# Patient Record
Sex: Female | Born: 1980 | Race: Black or African American | Hispanic: No | Marital: Single | Smoking: Former smoker
Health system: Southern US, Community
[De-identification: ages and names within clinical notes are randomized; demographics above are authoritative.]

## PROBLEM LIST (undated history)

## (undated) DIAGNOSIS — T148XXA Other injury of unspecified body region, initial encounter: Secondary | ICD-10-CM

## (undated) DIAGNOSIS — F32A Depression, unspecified: Secondary | ICD-10-CM

## (undated) DIAGNOSIS — E119 Type 2 diabetes mellitus without complications: Secondary | ICD-10-CM

## (undated) DIAGNOSIS — R519 Headache, unspecified: Secondary | ICD-10-CM

## (undated) DIAGNOSIS — F329 Major depressive disorder, single episode, unspecified: Secondary | ICD-10-CM

## (undated) DIAGNOSIS — R51 Headache: Secondary | ICD-10-CM

## (undated) HISTORY — PX: OTHER SURGICAL HISTORY: SHX169

---
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
## 2005-05-03 ENCOUNTER — Emergency Department (HOSPITAL_COMMUNITY): Admission: EM | Admit: 2005-05-03 | Discharge: 2005-05-03 | Payer: Self-pay | Admitting: Family Medicine

## 2005-12-01 ENCOUNTER — Inpatient Hospital Stay (HOSPITAL_COMMUNITY): Admission: AD | Admit: 2005-12-01 | Discharge: 2005-12-01 | Payer: Self-pay | Admitting: Family Medicine

## 2012-04-07 ENCOUNTER — Emergency Department (HOSPITAL_COMMUNITY)
Admission: EM | Admit: 2012-04-07 | Discharge: 2012-04-07 | Disposition: A | Discharge status: Home / Self Care | Payer: Self-pay | Source: Home / Self Care | Attending: Family Medicine | Admitting: Family Medicine

## 2012-04-07 NOTE — ED Notes (Signed)
No answer in lobby @ 16:47 & 17:05

## 2012-10-30 ENCOUNTER — Emergency Department (INDEPENDENT_AMBULATORY_CARE_PROVIDER_SITE_OTHER): Payer: BC Managed Care – PPO

## 2012-10-30 ENCOUNTER — Emergency Department (HOSPITAL_COMMUNITY)
Admission: EM | Admit: 2012-10-30 | Discharge: 2012-10-30 | Disposition: A | Discharge status: Home / Self Care | Payer: BC Managed Care – PPO | Source: Home / Self Care | Attending: Emergency Medicine | Admitting: Emergency Medicine

## 2012-10-30 ENCOUNTER — Encounter (HOSPITAL_COMMUNITY): Payer: Self-pay | Admitting: Emergency Medicine

## 2012-10-30 DIAGNOSIS — J069 Acute upper respiratory infection, unspecified: Secondary | ICD-10-CM

## 2012-10-30 MED ORDER — ALBUTEROL SULFATE (5 MG/ML) 0.5% IN NEBU
INHALATION_SOLUTION | RESPIRATORY_TRACT | Status: AC
  Filled 2012-10-30: qty 0.5

## 2012-10-30 MED ORDER — ALBUTEROL SULFATE (5 MG/ML) 0.5% IN NEBU
2.5000 mg | INHALATION_SOLUTION | Freq: Once | RESPIRATORY_TRACT | Status: AC
  Administered 2012-10-30: 2.5 mg via RESPIRATORY_TRACT

## 2012-10-30 MED ORDER — ALBUTEROL SULFATE (5 MG/ML) 0.5% IN NEBU
INHALATION_SOLUTION | RESPIRATORY_TRACT | Status: AC
  Filled 2012-10-30: qty 1

## 2012-10-30 MED ORDER — ALBUTEROL SULFATE (5 MG/ML) 0.5% IN NEBU
5.0000 mg | INHALATION_SOLUTION | Freq: Once | RESPIRATORY_TRACT | Status: AC
  Administered 2012-10-30: 5 mg via RESPIRATORY_TRACT

## 2012-10-30 MED ORDER — DEXAMETHASONE SODIUM PHOSPHATE 10 MG/ML IJ SOLN
10.0000 mg | Freq: Once | INTRAMUSCULAR | Status: AC
  Administered 2012-10-30: 10 mg via INTRAMUSCULAR

## 2012-10-30 MED ORDER — DEXAMETHASONE SODIUM PHOSPHATE 10 MG/ML IJ SOLN
INTRAMUSCULAR | Status: AC
  Filled 2012-10-30: qty 1

## 2012-10-30 MED ORDER — ALBUTEROL SULFATE HFA 108 (90 BASE) MCG/ACT IN AERS: 2.0000 | INHALATION_SPRAY | RESPIRATORY_TRACT | Status: AC | PRN

## 2012-10-30 MED ORDER — PSEUDOEPH-CHLORPHEN-HYDROCOD 60-4-5 MG/5ML PO SOLN: 5.0000 mL | Freq: Four times a day (QID) | ORAL | Status: AC | PRN

## 2012-10-30 NOTE — ED Notes (Signed)
Patient instructed to put on gown.  Breathing treatment in progress.

## 2012-10-30 NOTE — ED Notes (Signed)
Went to obtain patient for CXR patient was getting a breathing treatment

## 2012-10-30 NOTE — ED Provider Notes (Signed)
History     CSN: [id number]  Arrival date & time 10/30/12  1647   None     Chief Complaint  Patient presents with  . Cough    (Consider location/radiation/quality/duration/timing/severity/associated sxs/prior treatment) HPI Comments: Pt presents c/o not feeling well for a few days.  She has productive cough, SOB, fever, chills, sore throat from coughing.  A physician she works with gave her Rx for tussionex which helps but not significantly and not for a very long time.  She also feels like she is wheezing a lot.    Patient is a 32 y.o. female presenting with cough.  Cough Associated symptoms: chills, fever, shortness of breath, sore throat and wheezing   Associated symptoms: no chest pain, no myalgias and no rash     History reviewed. No pertinent past medical history.  History reviewed. No pertinent past surgical history.  No family history on file.  History  Substance Use Topics  . Smoking status: Current Every Day Smoker  . Smokeless tobacco: Not on file  . Alcohol Use: Yes    OB History   Grav Para Term Preterm Abortions TAB SAB Ect Mult Living                  Review of Systems  Constitutional: Positive for fever and chills. Negative for fatigue.  HENT: Positive for sore throat.   Eyes: Negative for visual disturbance.  Respiratory: Positive for cough, shortness of breath and wheezing. Negative for chest tightness.   Cardiovascular: Negative for chest pain, palpitations and leg swelling.  Gastrointestinal: Negative for nausea, vomiting and abdominal pain.  Endocrine: Negative for polydipsia and polyuria.  Genitourinary: Negative for dysuria, urgency and frequency.  Musculoskeletal: Negative for myalgias and arthralgias.  Skin: Negative for rash.  Neurological: Negative for dizziness, weakness and light-headedness.    Allergies  Review of patient's allergies indicates no known allergies.  Home Medications   Current Outpatient Rx  Name  Route  Sig   Dispense  Refill  . chlorpheniramine-HYDROcodone (TUSSIONEX PENNKINETIC ER) 10-8 MG/5ML LQCR   Oral   Take 5 mLs by mouth.         Marland Kitchen OVER THE COUNTER MEDICATION      nsaids         . sertraline (ZOLOFT) 100 MG tablet   Oral   Take 100 mg by mouth daily.         . TRAZODONE HCL PO   Oral   Take by mouth.         . Vitamin D, Ergocalciferol, (DRISDOL) 50000 UNITS CAPS   Oral   Take 50,000 Units by mouth.         Marland Kitchen albuterol (PROVENTIL HFA;VENTOLIN HFA) 108 (90 BASE) MCG/ACT inhaler   Inhalation   Inhale 2 puffs into the lungs every 4 (four) hours as needed for wheezing.   1 Inhaler   1   . Pseudoeph-Chlorphen-Hydrocod 60-4-5 MG/5ML SOLN   Oral   Take 5 mLs by mouth 4 (four) times daily as needed.   200 mL   0     BP 119/63  Pulse 81  Temp(Src) 98.7 F (37.1 C) (Oral)  Resp 16  SpO2 99%  LMP 10/05/2012  Physical Exam  Nursing note and vitals reviewed. Constitutional: She is oriented to person, place, and time. Vital signs are normal. She appears well-developed and well-nourished. No distress.  HENT:  Head: Atraumatic.  Eyes: EOM are normal. Pupils are equal, round, and reactive to light.  Cardiovascular: Normal rate, regular rhythm and normal heart sounds.  Exam reveals no gallop and no friction rub.   No murmur heard. Pulmonary/Chest: Effort normal. No respiratory distress. She has wheezes (expiratory, throughout). She has rhonchi in the right lower field and the left lower field. She has no rales.  Abdominal: Soft. There is no tenderness.  Neurological: She is alert and oriented to person, place, and time. She has normal strength.  Skin: Skin is warm and dry. She is not diaphoretic.  Psychiatric: She has a normal mood and affect. Her behavior is normal. Judgment normal.    ED Course  Procedures (including critical care time)  Labs Reviewed - No data to display Dg Chest 2 View  10/30/2012   *RADIOLOGY REPORT*  Clinical Data: Productive cough.   Shortness of breath.  CHEST - 2 VIEW  Comparison: None.  Findings: Very low lung volumes are seen, however both lungs are clear.  No evidence of pleural effusion.  Heart size and mediastinal contours are normal.  IMPRESSION: Very low lung volumes.  No active disease.   Original Report Authenticated By: Myles Rosenthal, M.D.     1. URI (upper respiratory infection)     After neb, pt not really feeling better but lungs clearer to auscultation, still wheezing however.  Will repeat neb  Improved significantly after 2nd neb   MDM  Normal CXR so this is most likely viral URI.  Will send home with albuterol inhaler and cough syrup.  Discussed symptoms of pneumonia that she will look out for.  She will f/u in a couple of days if she is getting worse or in about a week if not getting better.     Meds ordered this encounter  Medications                                     . albuterol (PROVENTIL) (5 MG/ML) 0.5% nebulizer solution 2.5 mg    Sig:   . dexamethasone (DECADRON) injection 10 mg    Sig:   . albuterol (PROVENTIL) (5 MG/ML) 0.5% nebulizer solution 5 mg    Sig:   . Pseudoeph-Chlorphen-Hydrocod 60-4-5 MG/5ML SOLN    Sig: Take 5 mLs by mouth 4 (four) times daily as needed.    Dispense:  200 mL    Refill:  0  . albuterol (PROVENTIL HFA;VENTOLIN HFA) 108 (90 BASE) MCG/ACT inhaler    Sig: Inhale 2 puffs into the lungs every 4 (four) hours as needed for wheezing.    Dispense:  1 Inhaler    Refill:  1        Graylon Good, PA-C 10/30/12 1858  Graylon Good, PA-C 10/30/12 1915

## 2012-10-30 NOTE — ED Provider Notes (Signed)
Medical screening examination/treatment/procedure(s) were performed by non-physician practitioner and as supervising physician I was immediately available for consultation/collaboration.  Leslee Home, M.D.  Reuben Likes, MD 10/30/12 2056

## 2012-10-30 NOTE — ED Notes (Signed)
C/o headache, random episodes of sweating, cough, chest congestion and wheezing and feverish.

## 2014-05-26 ENCOUNTER — Other Ambulatory Visit: Payer: Self-pay | Admitting: Family Medicine

## 2014-05-26 DIAGNOSIS — M2342 Loose body in knee, left knee: Secondary | ICD-10-CM

## 2014-06-05 ENCOUNTER — Inpatient Hospital Stay: Admission: RE | Admit: 2014-06-05 | Payer: BC Managed Care – PPO | Source: Ambulatory Visit

## 2015-10-18 ENCOUNTER — Encounter: Payer: Self-pay | Attending: Surgery | Admitting: Dietician

## 2015-10-18 DIAGNOSIS — Z6841 Body Mass Index (BMI) 40.0 and over, adult: Secondary | ICD-10-CM | POA: Insufficient documentation

## 2015-10-18 NOTE — Patient Instructions (Signed)
Follow Pre-Op Goals Try Protein Shakes Call [facility] at [phone] when surgery is scheduled to enroll in Pre-Op Class  Things to remember:  Please always be honest with us. We want to support you!  If you have any questions or concerns in between appointments, please call or email Liz, Viggo Perko, or Laurie.  The diet after surgery will be high protein and low in carbohydrate.  Vitamins and calcium need to be taken for the rest of your life.  Feel free to include support people in any classes or appointments.   Supplement recommendations:  "Complete" Multivitamin: Sleeve Gastrectomy and RYGB patients take a double dose of MVI. Vitamin must be liquid or chewable but not gummy. Examples of these include Flintstones Complete and Centrum Complete. If the vitamin is bariatric-specific, take 1 dose as it is already formulated for bariatric surgery patients. Examples of these are Bariatric Advantage, Celebrate, and Wellesse. These can be found at the [facility] and/or online.     Calcium citrate: 1500 mg/day of Calcium citrate (also chewable or liquid) is recommended for all procedures. The body is only able to absorb 500-600 mg of Calcium at one time so 3 daily doses of 500 mg are recommended. Calcium doses must be taken a minimum of 2 hours apart. Additionally, Calcium must be taken 2 hours apart from iron-containing MVI. Examples of brands include Celebrate, Bariatric Advantage, and Wellesse. These brands must be purchased online or at the  Outpatient Pharmacy. Citracal Petites is the only Calcium citrate supplement found in general grocery stores and pharmacies. This is in tablet form and may be recommended for patients who do not tolerate chewable Calcium.  Continued or added Vitamin D supplementation based on individual needs.    Vitamin B12: 300-500 mcg/day for Sleeve Gastrectomy and RYGB. Must be taken intramuscularly, sublingually, or inhaled nasally. Oral route  is not recommended.  

## 2015-10-18 NOTE — Progress Notes (Signed)
  Pre-Op Assessment Visit:  Pre-Operative Sleeve gastrectomy Surgery  Medical Nutrition Therapy:  Appt start time: 0845   End time:  0930.  Patient was seen on 10/18/2015 for Pre-Operative Nutrition Assessment. Assessment and letter of approval [facility] on 10/18/2015.   Preferred Learning Style:   No preference indicated   Learning Readiness:   Ready  Handouts given during visit include:  Pre-Op Goals Bariatric Surgery Protein Shakes   During the appointment today the following Pre-Op Goals were reviewed with the patient: Maintain or lose weight as instructed by your surgeon Make healthy food choices Begin to limit portion sizes Limited concentrated sugars and fried foods Keep fat/sugar in the single digits per serving on   food labels Practice CHEWING your food  (aim for 30 chews per bite or until applesauce consistency) Practice not drinking 15 minutes before, during, and 30 minutes after each meal/snack Avoid all carbonated beverages  Avoid/limit caffeinated beverages  Avoid all sugar-sweetened beverages Consume 3 meals per day; eat every 3-5 hours Make a list of non-food related activities Aim for 64-100 ounces of FLUID daily  Aim for at least 60-80 grams of PROTEIN daily Look for a liquid protein source that contain ?15 g protein and ?5 g carbohydrate  (ex: shakes, drinks, shots)  Demonstrated degree of understanding via:  Teach Back  Teaching Method Utilized:  Visual Auditory Hands on  Barriers to learning/adherence to lifestyle change: none  [facility] to [facility] the [facility] to enroll in Pre-Op and Post-Op Nutrition Education when surgery date is scheduled.

## 2015-11-09 ENCOUNTER — Other Ambulatory Visit (HOSPITAL_COMMUNITY): Payer: Self-pay | Admitting: Surgery

## 2015-11-10 ENCOUNTER — Ambulatory Visit (HOSPITAL_COMMUNITY)
Admission: RE | Admit: 2015-11-10 | Discharge: 2015-11-10 | Disposition: A | Discharge status: Home / Self Care | Payer: BLUE CROSS/BLUE SHIELD | Source: Ambulatory Visit | Attending: Surgery | Admitting: Surgery

## 2015-11-10 ENCOUNTER — Other Ambulatory Visit: Payer: Self-pay

## 2015-11-10 DIAGNOSIS — Z6841 Body Mass Index (BMI) 40.0 and over, adult: Secondary | ICD-10-CM | POA: Diagnosis not present

## 2015-11-10 DIAGNOSIS — F17200 Nicotine dependence, unspecified, uncomplicated: Secondary | ICD-10-CM | POA: Insufficient documentation

## 2015-11-10 DIAGNOSIS — R918 Other nonspecific abnormal finding of lung field: Secondary | ICD-10-CM | POA: Diagnosis not present

## 2015-11-15 ENCOUNTER — Encounter: Payer: BLUE CROSS/BLUE SHIELD | Attending: Surgery | Admitting: Dietician

## 2015-11-15 ENCOUNTER — Encounter: Payer: Self-pay | Admitting: Dietician

## 2015-11-15 DIAGNOSIS — Z6841 Body Mass Index (BMI) 40.0 and over, adult: Secondary | ICD-10-CM | POA: Insufficient documentation

## 2015-11-15 NOTE — Patient Instructions (Signed)
Continue to work on chewing well and not drinking during meals. Continue work on increasing activity. Continue to work on [employer]AM[employer] CorporationPre Op goals.

## 2015-11-15 NOTE — Progress Notes (Signed)
Supervised Weight Loss Visit:   Pre-Operative sleeve gastrectomy Surgery  Medical Nutrition Therapy:  Appt start time: 0735 end time:  0750.  Primary concerns today: Supervised Weight Loss Visit. Returns with a 2 lb weight loss. Dr. Daphine DeutscherMartin put her on phentermine about 3 weeks ago. Also started Metformin since last visit since Hgb A1c is around 6.5%. Has not noticed a huge change in appetite.   Personal trainer put her on a vegan diet about 2 days ago. Plan is that she will be on this for about 8 weeks to "cleanse". Meets with trainer 1 x week via skype. Has been working on chewing well and trying to not drink during meals. Usually eating 3 meals per day. Has tried [employer] Lean Shake and likes it. Just drinks water. Has not been eating sugary or fried foods.   Weight: 411.3 lbs BMI: 66.4  Preferred Learning Style:   No preference indicated   Learning Readiness:   Ready   24-hr recall: B (AM): [employer] lean shake and peach Snk (AM): none  L (PM): [employer] shake and fruit Snk (PM): none  D (PM): hummus wrap with veggies Snk (PM): none  Beverages:   Medications: see list  Recent physical activity:  Walking dog, moving more, trying to get 5000 steps per day  Progress Towards Goal(s):  In progress.  Handouts given during visit include:  none   Nutritional Diagnosis:  [place]-3.3 Obesity related to past poor dietary habits and physical inactivity as evidenced by patient attending supervised weight loss for insurance approval of bariatric surgery.    Intervention:  Nutrition counseling provided. Plan: Continue to work on chewing well and not drinking during meals. Continue work on increasing activity. Continue to work on [employer] CorporationPre Op goals.   Teaching Method Utilized:  Visual Auditory Hands on  Barriers to learning/adherence to lifestyle change: none  Demonstrated degree of understanding via:  Teach Back   Monitoring/Evaluation:  Dietary intake, exercise, and body weight. Follow up in 1  months for supervised weight loss visit.

## 2015-11-22 ENCOUNTER — Other Ambulatory Visit: Payer: Self-pay | Admitting: Ophthalmology

## 2015-11-22 DIAGNOSIS — H04033 Chronic enlargement of bilateral lacrimal glands: Secondary | ICD-10-CM

## 2015-12-01 ENCOUNTER — Inpatient Hospital Stay: Admission: RE | Admit: 2015-12-01 | Payer: BLUE CROSS/BLUE SHIELD | Source: Ambulatory Visit

## 2015-12-08 ENCOUNTER — Ambulatory Visit
Admission: RE | Admit: 2015-12-08 | Discharge: 2015-12-08 | Disposition: A | Discharge status: Home / Self Care | Payer: BLUE CROSS/BLUE SHIELD | Source: Ambulatory Visit | Attending: Ophthalmology | Admitting: Ophthalmology

## 2015-12-08 DIAGNOSIS — H04033 Chronic enlargement of bilateral lacrimal glands: Secondary | ICD-10-CM

## 2015-12-08 MED ORDER — IOPAMIDOL (ISOVUE-300) INJECTION 61%
75.0000 mL | Freq: Once | INTRAVENOUS | Status: AC | PRN
Start: 2015-12-08 — End: 2015-12-08
  Administered 2015-12-08: 75 mL via INTRAVENOUS

## 2015-12-14 ENCOUNTER — Ambulatory Visit: Payer: BLUE CROSS/BLUE SHIELD | Admitting: Dietician

## 2016-05-14 ENCOUNTER — Encounter: Payer: BLUE CROSS/BLUE SHIELD | Attending: Surgery | Admitting: Dietician

## 2016-05-14 DIAGNOSIS — Z713 Dietary counseling and surveillance: Secondary | ICD-10-CM | POA: Insufficient documentation

## 2016-05-14 DIAGNOSIS — Z6841 Body Mass Index (BMI) 40.0 and over, adult: Secondary | ICD-10-CM | POA: Diagnosis not present

## 2016-05-14 NOTE — Progress Notes (Signed)
Pt is scheduled for preop appt on 05/17/2016; please place surgical orders in epic. Thanks.  

## 2016-05-15 ENCOUNTER — Encounter: Payer: Self-pay | Admitting: Dietician

## 2016-05-15 NOTE — Progress Notes (Signed)
EKG 11-10-15 EPIC  CHEST XRAY 11-10-15 EPIC

## 2016-05-15 NOTE — Progress Notes (Signed)
  Pre-Operative Nutrition Class:  Appt start time: 2426   End time:  1830.  Patient was seen on 05/14/2016 for Pre-Operative Bariatric Surgery Education at the [facility].   Surgery date: 05/22/2016 Surgery type: sleeve gastrectomy Start weight at [facility]: 413 lbs on 10/18/2015 Weight today: 409.3 lbs  TANITA  BODY COMP RESULTS  05/14/16   BMI (kg/m^2) n/a   Fat Mass (lbs)    Fat Free Mass (lbs)    Total Body Water (lbs)    Samples given per MNT protocol. Patient educated on appropriate usage: Premier protein shake (strawberry - qty 1) Lot #: [id number] Exp: 03/2017  Unjury Protein Powder (chicken soup - qty 1) Lot #: [id number] Exp: 08/2017  The following the learning objectives were met by the patient during this course:  Identify Pre-Op Dietary Goals and will begin 2 weeks pre-operatively  Identify appropriate sources of fluids and proteins   State protein recommendations and appropriate sources pre and post-operatively  Identify Post-Operative Dietary Goals and will follow for 2 weeks post-operatively  Identify appropriate multivitamin and calcium sources  Describe the need for physical activity post-operatively and will follow MD recommendations  State when to call healthcare provider regarding medication questions or post-operative complications  Handouts given during class include:  Pre-Op Bariatric Surgery Diet Handout  Protein Shake Handout  Post-Op Bariatric Surgery Nutrition Handout  [facility] Program Information Flyer  Support Group Information Flyer  [facility] Bariatric Supplements Price List  Follow-Up Plan: Patient will follow-up at [facility] 2 weeks post operatively for diet advancement per MD.

## 2016-05-15 NOTE — Patient Instructions (Addendum)
Felipe DroneShavon G Shannahan  05/15/2016   Your procedure is scheduled on: 05-22-16  [facility]  [facility] 3rd floor to  Short Stay Center at 100 PM  Call this number if you have problems the morning of surgery [phone]   Remember: ONLY 1 PERSON MAY GO WITH YOU TO SHORT STAY TO GET  READY MORNING OF YOUR SURGERY.  Do not eat food :After Midnight, clear liquids from midnight until 900 am day of surgery, nothing by mouth after 900 am day of surgery.     Take these medicines the morning of surgery with A SIP OF WATER: DESVONLAFAXINE (PRESTIQ),  DO NOT TAKE ANY DIABETIC MEDICATIONS DAY OF YOUR SURGERY                               You may not have any metal on your body including hair pins and              piercings  Do not wear jewelry, make-up, lotions, powders or perfumes, deodorant             Do not wear nail polish.  Do not shave  48 hours prior to surgery.              Men may shave face and neck.   Do not bring valuables to the hospital. [facility] IS NOT             RESPONSIBLE   FOR VALUABLES.  Contacts, dentures or bridgework may not be worn into surgery.  Leave suitcase in the car. After surgery it may be brought to your room.                 Please read over the following fact sheets you were given: _____________________________________________________________________                CLEAR LIQUID DIET   Foods Allowed                                                                     Foods Excluded  Coffee and tea, regular and decaf                             liquids that you cannot  Plain Jell-O in any flavor                                             see through such as: Fruit ices (not with fruit pulp)                                     milk, soups, orange juice  Iced Popsicles  All solid food Carbonated beverages, regular and diet                                     Cranberry, grape and apple juices Sports drinks like Gatorade Lightly seasoned clear broth or consume(fat free) Sugar, honey syrup  Sample Menu Breakfast                                Lunch                                     Supper Cranberry juice                    Beef broth                            Chicken broth Jell-O                                     Grape juice                           Apple juice Coffee or tea                        Jell-O                                      Popsicle                                                Coffee or tea                        Coffee or tea  _____________________________________________________________________  How to Manage Your Diabetes Before and After Surgery  Why is it important to control my blood sugar before and after surgery? . Improving blood sugar levels before and after surgery helps healing and can limit problems. . A way of improving blood sugar control is eating a healthy diet by: o  Eating less sugar and carbohydrates o  Increasing activity/exercise o  Talking with your doctor about reaching your blood sugar goals . High blood sugars (greater than 180 mg/dL) can raise your risk of infections and slow your recovery, so you will need to focus on controlling your diabetes during the weeks before surgery. . Make sure that the doctor who takes care of your diabetes knows about your planned surgery including the date and location.  How do I manage my blood sugar before surgery? . Check your blood sugar at least 4 times a day, starting 2 days before surgery, to make sure that the level is not too high or low. o Check your blood sugar the morning of your surgery when you wake up and every 2 hours until you get to the Short Stay unit. . If your blood sugar is less  than 70 mg/dL, you will need to treat for low blood sugar: o Do not take insulin. o Treat a low blood sugar (less than 70 mg/dL) with  cup of clear juice  (cranberry or apple), 4 glucose tablets, OR glucose gel. o Recheck blood sugar in 15 minutes after treatment (to make sure it is greater than 70 mg/dL). If your blood sugar is not greater than 70 mg/dL on recheck, call [phone] for further instructions. . Report your blood sugar to the short stay nurse when you get to Short Stay.  . If you are admitted to the hospital after surgery: o Your blood sugar will be checked by the staff and you will probably be given insulin after surgery (instead of oral diabetes medicines) to make sure you have good blood sugar levels. o The goal for blood sugar control after surgery is 80-180 mg/dL.   WHAT DO I DO ABOUT MY DIABETES MEDICATION? YOU MAY TAKE METFORMIN THE DAY BEFORE YOUR SURGERY . Do not take oral diabetes medicines (pills) the morning of surgery.      Patient Signature:  Date:   Nurse Signature:  Date:   Reviewed and Endorsed by [facility] Education [facility], August 2015[facility] - Preparing for Surgery Before surgery, you can play an important role.  Because skin is not sterile, your skin needs to be as free of germs as possible.  You can reduce the number of germs on your skin by washing with CHG (chlorahexidine gluconate) soap before surgery.  CHG is an antiseptic cleaner which kills germs and bonds with the skin to continue killing germs even after washing. Please DO NOT use if you have an allergy to CHG or antibacterial soaps.  If your skin becomes reddened/irritated stop using the CHG and inform your nurse when you arrive at Short Stay. Do not shave (including legs and underarms) for at least 48 hours prior to the first CHG shower.  You may shave your face/neck. Please follow these instructions carefully:  1.  Shower with CHG Soap the night before surgery and the  morning of Surgery.  2.  If you choose to wash your hair, wash your hair first as usual with your  normal  shampoo.  3.  After you shampoo, rinse your hair and  body thoroughly to remove the  shampoo.                           4.  Use CHG as you would any other liquid soap.  You can apply chg directly  to the skin and wash                       Gently with a scrungie or clean washcloth.  5.  Apply the CHG Soap to your body ONLY FROM THE NECK DOWN.   Do not use on face/ open                           Wound or open sores. Avoid contact with eyes, ears mouth and genitals (private parts).                       Wash face,  Genitals (private parts) with your normal soap.             6.  Wash thoroughly, paying special attention to the area where your surgery  will  be performed.  7.  Thoroughly rinse your body with warm water from the neck down.  8.  DO NOT shower/wash with your normal soap after using and rinsing off  the CHG Soap.                9.  Pat yourself dry with a clean towel.            10.  Wear clean pajamas.            11.  Place clean sheets on your bed the night of your first shower and do not  sleep with pets. Day of Surgery : Do not apply any lotions/deodorants the morning of surgery.  Please wear clean clothes to the hospital/surgery center.

## 2016-05-16 ENCOUNTER — Ambulatory Visit: Payer: Self-pay | Admitting: Surgery

## 2016-05-17 ENCOUNTER — Encounter (HOSPITAL_COMMUNITY)
Admission: RE | Admit: 2016-05-17 | Discharge: 2016-05-17 | Disposition: A | Discharge status: Home / Self Care | Payer: BLUE CROSS/BLUE SHIELD | Source: Ambulatory Visit | Attending: Surgery | Admitting: Surgery

## 2016-05-17 ENCOUNTER — Encounter (HOSPITAL_COMMUNITY): Payer: Self-pay

## 2016-05-17 DIAGNOSIS — E669 Obesity, unspecified: Secondary | ICD-10-CM | POA: Diagnosis not present

## 2016-05-17 DIAGNOSIS — Z6841 Body Mass Index (BMI) 40.0 and over, adult: Secondary | ICD-10-CM | POA: Diagnosis not present

## 2016-05-17 DIAGNOSIS — Z01812 Encounter for preprocedural laboratory examination: Secondary | ICD-10-CM | POA: Diagnosis present

## 2016-05-17 HISTORY — DX: Major depressive disorder, single episode, unspecified: F32.9

## 2016-05-17 HISTORY — DX: Other injury of unspecified body region, initial encounter: T14.8XXA

## 2016-05-17 HISTORY — DX: Type 2 diabetes mellitus without complications: E11.9

## 2016-05-17 HISTORY — DX: Headache, unspecified: R51.9

## 2016-05-17 HISTORY — DX: Headache: R51

## 2016-05-17 HISTORY — DX: Depression, unspecified: F32.A

## 2016-05-17 LAB — CBC WITH DIFFERENTIAL/PLATELET
BASOS ABS: 0 10*3/uL (ref 0.0–0.1)
Basophils Relative: 0 %
EOS ABS: 0.1 10*3/uL (ref 0.0–0.7)
Eosinophils Relative: 1 %
HCT: 40.8 % (ref 36.0–46.0)
HEMOGLOBIN: 13.3 g/dL (ref 12.0–15.0)
LYMPHS ABS: 2 10*3/uL (ref 0.7–4.0)
LYMPHS PCT: 25 %
MCH: 24.4 pg — ABNORMAL LOW (ref 26.0–34.0)
MCHC: 32.6 g/dL (ref 30.0–36.0)
MCV: 74.7 fL — ABNORMAL LOW (ref 78.0–100.0)
MONO ABS: 0.5 10*3/uL (ref 0.1–1.0)
Monocytes Relative: 6 %
NEUTROS ABS: 5.2 10*3/uL (ref 1.7–7.7)
Neutrophils Relative %: 68 %
PLATELETS: 381 10*3/uL (ref 150–400)
RBC: 5.46 MIL/uL — ABNORMAL HIGH (ref 3.87–5.11)
RDW: 14.3 % (ref 11.5–15.5)
WBC: 7.8 10*3/uL (ref 4.0–10.5)

## 2016-05-17 LAB — GLUCOSE, CAPILLARY: GLUCOSE-CAPILLARY: 130 mg/dL — AB (ref 65–99)

## 2016-05-17 LAB — COMPREHENSIVE METABOLIC PANEL
ALBUMIN: 3.6 g/dL (ref 3.5–5.0)
ALT: 9 U/L — ABNORMAL LOW (ref 14–54)
ANION GAP: 8 (ref 5–15)
AST: 18 U/L (ref 15–41)
Alkaline Phosphatase: 70 U/L (ref 38–126)
BUN: 13 mg/dL (ref 6–20)
CHLORIDE: 103 mmol/L (ref 101–111)
CO2: 25 mmol/L (ref 22–32)
Calcium: 9.1 mg/dL (ref 8.9–10.3)
Creatinine, Ser: 0.61 mg/dL (ref 0.44–1.00)
GFR calc non Af Amer: >60 mL/min (ref >60)
GLUCOSE: 106 mg/dL — AB (ref 65–99)
POTASSIUM: 4.2 mmol/L (ref 3.5–5.1)
SODIUM: 136 mmol/L (ref 135–145)
Total Bilirubin: 0.5 mg/dL (ref 0.3–1.2)
Total Protein: 7.6 g/dL (ref 6.5–8.1)

## 2016-05-17 NOTE — Progress Notes (Signed)
barimas bed with trapeze ordered with derrick from portable equipment

## 2016-05-18 LAB — HEMOGLOBIN A1C
HEMOGLOBIN A1C: 6.2 % — AB (ref 4.8–5.6)
MEAN PLASMA GLUCOSE: 131 mg/dL

## 2016-05-22 ENCOUNTER — Inpatient Hospital Stay (HOSPITAL_COMMUNITY): Payer: BLUE CROSS/BLUE SHIELD | Admitting: Anesthesiology

## 2016-05-22 ENCOUNTER — Inpatient Hospital Stay (HOSPITAL_COMMUNITY)
Admission: RE | Admit: 2016-05-22 | Discharge: 2016-05-24 | DRG: 621 | Disposition: A | Discharge status: Home / Self Care | Payer: BLUE CROSS/BLUE SHIELD | Source: Ambulatory Visit | Attending: Surgery | Admitting: Surgery

## 2016-05-22 ENCOUNTER — Encounter (HOSPITAL_COMMUNITY)
Admission: RE | Disposition: A | Discharge status: Home / Self Care | Payer: Self-pay | Source: Ambulatory Visit | Attending: Surgery

## 2016-05-22 ENCOUNTER — Encounter (HOSPITAL_COMMUNITY): Payer: Self-pay | Admitting: *Deleted

## 2016-05-22 DIAGNOSIS — Z7984 Long term (current) use of oral hypoglycemic drugs: Secondary | ICD-10-CM | POA: Diagnosis not present

## 2016-05-22 DIAGNOSIS — Z6841 Body Mass Index (BMI) 40.0 and over, adult: Secondary | ICD-10-CM | POA: Diagnosis not present

## 2016-05-22 DIAGNOSIS — Z79899 Other long term (current) drug therapy: Secondary | ICD-10-CM

## 2016-05-22 DIAGNOSIS — Z9884 Bariatric surgery status: Secondary | ICD-10-CM

## 2016-05-22 DIAGNOSIS — Z888 Allergy status to other drugs, medicaments and biological substances status: Secondary | ICD-10-CM | POA: Diagnosis not present

## 2016-05-22 HISTORY — PX: LAPAROSCOPIC GASTRIC SLEEVE RESECTION: SHX5895

## 2016-05-22 LAB — CREATININE, SERUM
Creatinine, Ser: 0.77 mg/dL (ref 0.44–1.00)
GFR calc Af Amer: >60 mL/min (ref >60)

## 2016-05-22 LAB — GLUCOSE, CAPILLARY
GLUCOSE-CAPILLARY: 170 mg/dL — AB (ref 65–99)
GLUCOSE-CAPILLARY: 94 mg/dL (ref 65–99)
Glucose-Capillary: 204 mg/dL — ABNORMAL HIGH (ref 65–99)
Glucose-Capillary: 232 mg/dL — ABNORMAL HIGH (ref 65–99)

## 2016-05-22 LAB — CBC
HEMATOCRIT: 39.9 % (ref 36.0–46.0)
HEMOGLOBIN: 12.9 g/dL (ref 12.0–15.0)
MCH: 23.9 pg — ABNORMAL LOW (ref 26.0–34.0)
MCHC: 32.3 g/dL (ref 30.0–36.0)
MCV: 74 fL — ABNORMAL LOW (ref 78.0–100.0)
Platelets: 346 10*3/uL (ref 150–400)
RBC: 5.39 MIL/uL — ABNORMAL HIGH (ref 3.87–5.11)
RDW: 14.2 % (ref 11.5–15.5)
WBC: 17.1 10*3/uL — ABNORMAL HIGH (ref 4.0–10.5)

## 2016-05-22 LAB — PREGNANCY, URINE: PREG TEST UR: NEGATIVE

## 2016-05-22 SURGERY — GASTRECTOMY, SLEEVE, LAPAROSCOPIC
Anesthesia: General | Site: Abdomen

## 2016-05-22 MED ORDER — ROCURONIUM BROMIDE 50 MG/5ML IV SOSY
PREFILLED_SYRINGE | INTRAVENOUS | Status: AC
  Filled 2016-05-22: qty 5

## 2016-05-22 MED ORDER — PROPOFOL 10 MG/ML IV BOLUS
INTRAVENOUS | Status: AC
  Filled 2016-05-22: qty 20

## 2016-05-22 MED ORDER — HYDROMORPHONE HCL 1 MG/ML IJ SOLN
0.2500 mg | INTRAMUSCULAR | Status: DC | PRN
  Administered 2016-05-22 (×4): 0.5 mg via INTRAVENOUS

## 2016-05-22 MED ORDER — ONDANSETRON HCL 4 MG/2ML IJ SOLN
INTRAMUSCULAR | Status: AC
  Filled 2016-05-22: qty 2

## 2016-05-22 MED ORDER — SUGAMMADEX SODIUM 200 MG/2ML IV SOLN
INTRAVENOUS | Status: DC | PRN
  Administered 2016-05-22 (×2): 200 mg via INTRAVENOUS

## 2016-05-22 MED ORDER — LIDOCAINE 2% (20 MG/ML) 5 ML SYRINGE
INTRAMUSCULAR | Status: DC | PRN
  Administered 2016-05-22: 100 mg via INTRAVENOUS

## 2016-05-22 MED ORDER — ACETAMINOPHEN 160 MG/5ML PO SOLN
650.0000 mg | ORAL | Status: DC | PRN
  Administered 2016-05-23: 650 mg via ORAL
  Filled 2016-05-22: qty 20.3

## 2016-05-22 MED ORDER — CHLORHEXIDINE GLUCONATE CLOTH 2 % EX PADS: 6.0000 | MEDICATED_PAD | Freq: Once | CUTANEOUS | Status: DC

## 2016-05-22 MED ORDER — HEPARIN SODIUM (PORCINE) 5000 UNIT/ML IJ SOLN
5000.0000 [IU] | Freq: Three times a day (TID) | INTRAMUSCULAR | Status: DC
  Administered 2016-05-22 – 2016-05-24 (×5): 5000 [IU] via SUBCUTANEOUS
  Filled 2016-05-22 (×5): qty 1

## 2016-05-22 MED ORDER — FENTANYL CITRATE (PF) 250 MCG/5ML IJ SOLN
INTRAMUSCULAR | Status: AC
  Filled 2016-05-22: qty 5

## 2016-05-22 MED ORDER — MIDAZOLAM HCL 2 MG/2ML IJ SOLN
INTRAMUSCULAR | Status: AC
  Filled 2016-05-22: qty 2

## 2016-05-22 MED ORDER — LIDOCAINE 2% (20 MG/ML) 5 ML SYRINGE
INTRAMUSCULAR | Status: AC
  Filled 2016-05-22: qty 5

## 2016-05-22 MED ORDER — INSULIN ASPART 100 UNIT/ML ~~LOC~~ SOLN
0.0000 [IU] | SUBCUTANEOUS | Status: DC
  Administered 2016-05-22: 7 [IU] via SUBCUTANEOUS
  Administered 2016-05-23 (×2): 4 [IU] via SUBCUTANEOUS
  Administered 2016-05-23 – 2016-05-24 (×2): 3 [IU] via SUBCUTANEOUS

## 2016-05-22 MED ORDER — APREPITANT 80 MG PO CAPS
80.0000 mg | ORAL_CAPSULE | ORAL | Status: AC
  Administered 2016-05-22: 80 mg via ORAL
  Filled 2016-05-22: qty 1

## 2016-05-22 MED ORDER — SUGAMMADEX SODIUM 200 MG/2ML IV SOLN
INTRAVENOUS | Status: AC
  Filled 2016-05-22: qty 2

## 2016-05-22 MED ORDER — HYDROMORPHONE HCL 1 MG/ML IJ SOLN
INTRAMUSCULAR | Status: AC
  Administered 2016-05-22: 0.5 mg via INTRAVENOUS
  Filled 2016-05-22: qty 1

## 2016-05-22 MED ORDER — ACETAMINOPHEN 10 MG/ML IV SOLN
1000.0000 mg | Freq: Once | INTRAVENOUS | Status: AC
  Administered 2016-05-22: 1000 mg via INTRAVENOUS

## 2016-05-22 MED ORDER — ROCURONIUM BROMIDE 50 MG/5ML IV SOSY: PREFILLED_SYRINGE | INTRAVENOUS | Status: DC | PRN

## 2016-05-22 MED ORDER — SUCCINYLCHOLINE CHLORIDE 200 MG/10ML IV SOSY
PREFILLED_SYRINGE | INTRAVENOUS | Status: AC
  Filled 2016-05-22: qty 10

## 2016-05-22 MED ORDER — CEFOTETAN DISODIUM-DEXTROSE 2-2.08 GM-% IV SOLR
2.0000 g | INTRAVENOUS | Status: AC
  Administered 2016-05-22: 2 g via INTRAVENOUS

## 2016-05-22 MED ORDER — MIDAZOLAM HCL 2 MG/2ML IJ SOLN
INTRAMUSCULAR | Status: DC | PRN
  Administered 2016-05-22: 2 mg via INTRAVENOUS

## 2016-05-22 MED ORDER — SUCCINYLCHOLINE CHLORIDE 200 MG/10ML IV SOSY
PREFILLED_SYRINGE | INTRAVENOUS | Status: DC | PRN
  Administered 2016-05-22: 120 mg via INTRAVENOUS

## 2016-05-22 MED ORDER — ONDANSETRON HCL 4 MG/2ML IJ SOLN
4.0000 mg | INTRAMUSCULAR | Status: DC | PRN
  Administered 2016-05-23 – 2016-05-24 (×3): 4 mg via INTRAVENOUS
  Filled 2016-05-22 (×3): qty 2

## 2016-05-22 MED ORDER — SODIUM CHLORIDE 0.9 % IJ SOLN
INTRAMUSCULAR | Status: DC | PRN
  Administered 2016-05-22: 10 mL

## 2016-05-22 MED ORDER — BUPIVACAINE LIPOSOME 1.3 % IJ SUSP
20.0000 mL | Freq: Once | INTRAMUSCULAR | Status: AC
  Administered 2016-05-22: 20 mL
  Filled 2016-05-22: qty 20

## 2016-05-22 MED ORDER — ONDANSETRON HCL 4 MG/2ML IJ SOLN
INTRAMUSCULAR | Status: DC | PRN
  Administered 2016-05-22: 4 mg via INTRAVENOUS

## 2016-05-22 MED ORDER — CEFOTETAN DISODIUM-DEXTROSE 2-2.08 GM-% IV SOLR
INTRAVENOUS | Status: AC
  Filled 2016-05-22: qty 50

## 2016-05-22 MED ORDER — OXYCODONE HCL 5 MG/5ML PO SOLN
5.0000 mg | ORAL | Status: DC | PRN
  Administered 2016-05-24 (×3): 5 mg via ORAL
  Filled 2016-05-22 (×3): qty 5

## 2016-05-22 MED ORDER — LACTATED RINGERS IV SOLN
INTRAVENOUS | Status: DC
  Administered 2016-05-22: 1000 mL via INTRAVENOUS

## 2016-05-22 MED ORDER — KCL IN DEXTROSE-NACL 20-5-0.45 MEQ/L-%-% IV SOLN
INTRAVENOUS | Status: DC
  Administered 2016-05-22 – 2016-05-23 (×3): via INTRAVENOUS
  Filled 2016-05-22 (×5): qty 1000

## 2016-05-22 MED ORDER — ACETAMINOPHEN 10 MG/ML IV SOLN
INTRAVENOUS | Status: AC
  Administered 2016-05-22: 1000 mg via INTRAVENOUS
  Filled 2016-05-22: qty 100

## 2016-05-22 MED ORDER — PANTOPRAZOLE SODIUM 40 MG IV SOLR
40.0000 mg | Freq: Every day | INTRAVENOUS | Status: DC
  Administered 2016-05-22 – 2016-05-23 (×2): 40 mg via INTRAVENOUS
  Filled 2016-05-22 (×2): qty 40

## 2016-05-22 MED ORDER — METOCLOPRAMIDE HCL 5 MG/ML IJ SOLN
INTRAMUSCULAR | Status: AC
  Administered 2016-05-22: 10 mg via INTRAVENOUS
  Filled 2016-05-22: qty 2

## 2016-05-22 MED ORDER — ROCURONIUM BROMIDE 50 MG/5ML IV SOSY
PREFILLED_SYRINGE | INTRAVENOUS | Status: DC | PRN
  Administered 2016-05-22: 50 mg via INTRAVENOUS
  Administered 2016-05-22: 10 mg via INTRAVENOUS

## 2016-05-22 MED ORDER — METOCLOPRAMIDE HCL 5 MG/ML IJ SOLN
10.0000 mg | Freq: Once | INTRAMUSCULAR | Status: AC
  Administered 2016-05-22: 10 mg via INTRAVENOUS

## 2016-05-22 MED ORDER — DEXAMETHASONE SODIUM PHOSPHATE 10 MG/ML IJ SOLN
INTRAMUSCULAR | Status: AC
  Filled 2016-05-22: qty 1

## 2016-05-22 MED ORDER — SODIUM CHLORIDE 0.9 % IJ SOLN
INTRAMUSCULAR | Status: AC
  Filled 2016-05-22: qty 10

## 2016-05-22 MED ORDER — 0.9 % SODIUM CHLORIDE (POUR BTL) OPTIME
TOPICAL | Status: DC | PRN
  Administered 2016-05-22: 1000 mL

## 2016-05-22 MED ORDER — HEPARIN SODIUM (PORCINE) 5000 UNIT/ML IJ SOLN
5000.0000 [IU] | INTRAMUSCULAR | Status: AC
  Administered 2016-05-22: 5000 [IU] via SUBCUTANEOUS
  Filled 2016-05-22: qty 1

## 2016-05-22 MED ORDER — ACETAMINOPHEN 160 MG/5ML PO SOLN: 325.0000 mg | ORAL | Status: DC | PRN

## 2016-05-22 MED ORDER — MORPHINE SULFATE (PF) 2 MG/ML IV SOLN
2.0000 mg | INTRAVENOUS | Status: DC | PRN
  Administered 2016-05-22: 4 mg via INTRAVENOUS
  Administered 2016-05-22: 2 mg via INTRAVENOUS
  Administered 2016-05-23 (×2): 4 mg via INTRAVENOUS
  Administered 2016-05-23: 6 mg via INTRAVENOUS
  Administered 2016-05-23 (×3): 4 mg via INTRAVENOUS
  Filled 2016-05-22 (×3): qty 2
  Filled 2016-05-22: qty 3
  Filled 2016-05-22 (×3): qty 2
  Filled 2016-05-22: qty 1

## 2016-05-22 MED ORDER — PROPOFOL 10 MG/ML IV BOLUS
INTRAVENOUS | Status: DC | PRN
  Administered 2016-05-22: 200 mg via INTRAVENOUS

## 2016-05-22 MED ORDER — PROMETHAZINE HCL 25 MG/ML IJ SOLN: 6.2500 mg | INTRAMUSCULAR | Status: DC | PRN

## 2016-05-22 MED ORDER — PREMIER PROTEIN SHAKE
2.0000 [oz_av] | ORAL | Status: DC
  Administered 2016-05-24 (×3): 2 [oz_av] via ORAL

## 2016-05-22 MED ORDER — LACTATED RINGERS IR SOLN
Status: DC | PRN
  Administered 2016-05-22: 3000 mL

## 2016-05-22 MED ORDER — FENTANYL CITRATE (PF) 250 MCG/5ML IJ SOLN
INTRAMUSCULAR | Status: DC | PRN
  Administered 2016-05-22: 25 ug via INTRAVENOUS
  Administered 2016-05-22: 50 ug via INTRAVENOUS
  Administered 2016-05-22: 25 ug via INTRAVENOUS
  Administered 2016-05-22 (×3): 50 ug via INTRAVENOUS

## 2016-05-22 MED ORDER — DEXAMETHASONE SODIUM PHOSPHATE 10 MG/ML IJ SOLN
INTRAMUSCULAR | Status: DC | PRN
  Administered 2016-05-22: 10 mg via INTRAVENOUS

## 2016-05-22 SURGICAL SUPPLY — 63 items
APPLICATOR COTTON TIP 6IN STRL (MISCELLANEOUS) IMPLANT
APPLIER CLIP 5 13 M/L LIGAMAX5 (MISCELLANEOUS)
APPLIER CLIP ROT 10 11.4 M/L (STAPLE)
APPLIER CLIP ROT 13.4 12 LRG (CLIP)
BLADE SURG 15 STRL LF DISP TIS (BLADE) ×1 IMPLANT
BLADE SURG 15 STRL SS (BLADE) ×1
CABLE HIGH FREQUENCY MONO STRZ (ELECTRODE) ×2 IMPLANT
CLIP APPLIE 5 13 M/L LIGAMAX5 (MISCELLANEOUS) IMPLANT
CLIP APPLIE ROT 10 11.4 M/L (STAPLE) IMPLANT
CLIP APPLIE ROT 13.4 12 LRG (CLIP) IMPLANT
COVER SURGICAL LIGHT HANDLE (MISCELLANEOUS) ×2 IMPLANT
DERMABOND ADVANCED (GAUZE/BANDAGES/DRESSINGS) ×1
DERMABOND ADVANCED .7 DNX12 (GAUZE/BANDAGES/DRESSINGS) ×1 IMPLANT
DEVICE SUT QUICK LOAD TK 5 (STAPLE) IMPLANT
DEVICE SUT TI-KNOT TK 5X26 (MISCELLANEOUS) IMPLANT
DEVICE SUTURE ENDOST 10MM (ENDOMECHANICALS) IMPLANT
DEVICE TROCAR PUNCTURE CLOSURE (ENDOMECHANICALS) ×2 IMPLANT
DISSECTOR BLUNT TIP ENDO 5MM (MISCELLANEOUS) IMPLANT
ELECT REM PT RETURN 9FT ADLT (ELECTROSURGICAL) ×2
ELECTRODE REM PT RTRN 9FT ADLT (ELECTROSURGICAL) ×1 IMPLANT
GAUZE SPONGE 4X4 12PLY STRL (GAUZE/BANDAGES/DRESSINGS) IMPLANT
GLOVE BIOGEL M 8.0 STRL (GLOVE) ×2 IMPLANT
GOWN STRL REUS W/TWL XL LVL3 (GOWN DISPOSABLE) ×8 IMPLANT
HANDLE STAPLE EGIA 4 XL (STAPLE) ×2 IMPLANT
HOVERMATT SINGLE USE (MISCELLANEOUS) ×2 IMPLANT
IRRIG SUCT STRYKERFLOW 2 WTIP (MISCELLANEOUS)
IRRIGATION SUCT STRKRFLW 2 WTP (MISCELLANEOUS) IMPLANT
KIT BASIN OR (CUSTOM PROCEDURE TRAY) ×2 IMPLANT
LIQUID BAND (GAUZE/BANDAGES/DRESSINGS) ×2 IMPLANT
MARKER SKIN DUAL TIP RULER LAB (MISCELLANEOUS) ×2 IMPLANT
NEEDLE SPNL 22GX3.5 QUINCKE BK (NEEDLE) ×2 IMPLANT
PACK UNIVERSAL I (CUSTOM PROCEDURE TRAY) ×2 IMPLANT
POUCH SPECIMEN RETRIEVAL 10MM (ENDOMECHANICALS) IMPLANT
RELOAD TRI 45 ART MED THCK BLK (STAPLE) ×2 IMPLANT
RELOAD TRI 45 ART MED THCK PUR (STAPLE) IMPLANT
RELOAD TRI 60 ART MED THCK BLK (STAPLE) ×4 IMPLANT
RELOAD TRI 60 ART MED THCK PUR (STAPLE) ×4 IMPLANT
SCISSORS LAP 5X45 EPIX DISP (ENDOMECHANICALS) IMPLANT
SEALANT SURGICAL APPL DUAL CAN (MISCELLANEOUS) IMPLANT
SET IRRIG TUBING LAPAROSCOPIC (IRRIGATION / IRRIGATOR) ×2 IMPLANT
SHEARS HARMONIC ACE PLUS 45CM (MISCELLANEOUS) ×2 IMPLANT
SLEEVE ADV FIXATION 5X100MM (TROCAR) ×4 IMPLANT
SLEEVE GASTRECTOMY 36FR VISIGI (MISCELLANEOUS) ×2 IMPLANT
SOLUTION ANTI FOG 6CC (MISCELLANEOUS) ×2 IMPLANT
SPONGE LAP 18X18 X RAY DECT (DISPOSABLE) ×2 IMPLANT
STAPLER VISISTAT 35W (STAPLE) ×2 IMPLANT
SUT SURGIDAC NAB ES-9 0 48 120 (SUTURE) IMPLANT
SUT VIC AB 4-0 SH 18 (SUTURE) ×2 IMPLANT
SUT VICRYL 0 TIES 12 18 (SUTURE) ×2 IMPLANT
SYR 10ML ECCENTRIC (SYRINGE) ×2 IMPLANT
SYR 20CC LL (SYRINGE) ×2 IMPLANT
SYR 50ML LL SCALE MARK (SYRINGE) ×2 IMPLANT
SYSTEM WECK SHIELD CLOSURE (TROCAR) IMPLANT
TOWEL OR 17X26 10 PK STRL BLUE (TOWEL DISPOSABLE) ×4 IMPLANT
TOWEL OR NON WOVEN STRL DISP B (DISPOSABLE) ×2 IMPLANT
TROCAR ADV FIXATION 12X100MM (TROCAR) ×2 IMPLANT
TROCAR ADV FIXATION 5X100MM (TROCAR) ×2 IMPLANT
TROCAR BLADELESS 15MM (ENDOMECHANICALS) ×2 IMPLANT
TROCAR BLADELESS OPT 5 100 (ENDOMECHANICALS) ×2 IMPLANT
TUBE CALIBRATION LAPBAND (TUBING) IMPLANT
TUBING CONNECTING 10 (TUBING) ×4 IMPLANT
TUBING ENDO SMARTCAP (MISCELLANEOUS) ×2 IMPLANT
TUBING INSUF HEATED (TUBING) ×2 IMPLANT

## 2016-05-22 NOTE — Op Note (Signed)
Name:  Taylor Jimenez MRN: [id number] Date of Surgery: 05/22/2016  Preop Diagnosis:  Morbid Obesity  Postop Diagnosis:  Morbid Obesity (Weight - 409, BMI - 66.1), S/P Gastric Sleeve  Procedure:  Upper endoscopy  (Intraoperative)  Surgeon:  Ovidio Kinavid Kimberleigh Mehan, M.D.  Anesthesia:  GET  Indications for procedure: Taylor Jimenez is a 35 y.o. female whose [facility] and has completed a Gastric Sleeve today by Dr. Daphine DeutscherMartin.  I am doing an intraoperative upper endoscopy to evaluate the gastric pouch.  Operative Note: The patient is under general anesthesia.  Dr. Daphine DeutscherMartin is laparoscoping the patient while I do an upper endoscopy to evaluate the stomach pouch.  With the patient intubated, I passed the Pentax upper endoscope without difficulty down the esophagus.  The esophagus was unremarkable.  The esophago-gastric junction was at 40 cm.    The mucosa of the stomach looked viable and the staple line was intact without bleeding.  I advanced the scope to the pylorus, but did not go through it.  While I insufflated the stomach pouch with air, Dr. Daphine DeutscherMartin  flooded the upper abdomen with saline to put the gastric pouch under saline.  There was no bubbling or evidence of a leak.  There was no evidence of narrowing of the pouch and the gastric sleeve looked tubular.  The scope was then withdrawn.  The esophagus was unremarkable and the patient tolerated the endoscopy without difficulty.  Ovidio Kinavid Eriverto Byrnes, MD, [facility] Pager: [phone] Office phone:  [phone]

## 2016-05-22 NOTE — Anesthesia Preprocedure Evaluation (Signed)
Anesthesia Evaluation  Patient identified by MRN, date of birth, ID band Patient awake    Reviewed: Allergy & Precautions, NPO status , Patient's Chart, lab work & pertinent test results  Airway Mallampati: II       Dental   Pulmonary neg pulmonary ROS, former smoker,    breath sounds clear to auscultation       Cardiovascular negative cardio ROS   Rhythm:Regular Rate:Normal     Neuro/Psych  Headaches,    GI/Hepatic Neg liver ROS, GI history noted. CG   Endo/Other  diabetes  Renal/GU negative Renal ROS     Musculoskeletal   Abdominal   Peds  Hematology   Anesthesia Other Findings   Reproductive/Obstetrics                             Anesthesia Physical Anesthesia Plan  ASA: III  Anesthesia Plan: General   Post-op Pain Management:    Induction: Intravenous  Airway Management Planned: Oral ETT  Additional Equipment:   Intra-op Plan:   Post-operative Plan: Extubation in OR  Informed Consent: I have reviewed the patients History and Physical, chart, labs and discussed the procedure including the risks, benefits and alternatives for the proposed anesthesia with the patient or authorized representative who has indicated his/her understanding and acceptance.   Dental advisory given  Plan Discussed with: CRNA and Anesthesiologist  Anesthesia Plan Comments:         Anesthesia Quick Evaluation

## 2016-05-22 NOTE — Transfer of Care (Signed)
Immediate Anesthesia Transfer of Care Note  Patient: Felipe DroneShavon G Hudnall  Procedure(s) Performed: Procedure(s): LAPAROSCOPIC GASTRIC SLEEVE RESECTION, UPPER ENDOSCOPY (N/A)  Patient Location: PACU  Anesthesia Type:General  Level of Consciousness: awake, alert  and patient cooperative  Airway & Oxygen Therapy: Patient Spontanous Breathing and Patient connected to face mask oxygen  Post-op Assessment: Report given to RN and Post -op Vital signs reviewed and stable  Post vital signs: Reviewed and stable  Last Vitals:  Vitals:   05/22/16 1322  BP: 138/87  Pulse: (!) 103  Resp: 16  Temp: 37.1 C    Last Pain:  Vitals:   05/22/16 1322  TempSrc: Oral      Patients Stated Pain Goal: 4 (05/22/16 1358)  Complications: No apparent anesthesia complications

## 2016-05-22 NOTE — Op Note (Signed)
Surgeon: Wenda LowMatt Kendry Pfarr, MD, FACS  Asst:  Ovidio Kinavid Newman, MD, FACS  Anes:  General endotracheal  Procedure: Laparoscopic sleeve gastrectomy and upper endoscopy  Diagnosis: Morbid obesity  Complications: none  EBL:   20 cc  Description of Procedure:  The patient was take to OR 4 and given general anesthesia.  The abdomen was prepped with PCMX and draped sterilely.  A timeout was performed.  Access to the abdomen was achieved with a 5 mm Optiview technique through the left upper quadrant.  Following insufflation, the state of the abdomen was found to be free of adhesions except for Curtis-FitzHugh bands atop the liver.  The ViSiGi 36Fr tube was inserted to deflate the stomach and was pulled back into the esophagus.    The pylorus was identified and we measured 5 cm back and marked the antrum.  At that point we began dissection to take down the greater curvature of the stomach using the Harmonic scalpel.  This dissection was taken all the way up to the left crus.  Posterior attachments of the stomach were also taken down.    The ViSiGi tube was then passed into the antrum and suction applied so that it was snug along the lessor curvature.  The "crow's foot" or incisura was identified.  The sleeve gastrectomy was begun using the [employer] InternationalCovidien platform stapler beginning with a 4.5 cm black load with TRS followed by two 6 cm black load with TRS and then purple with TRS.  Marland Kitchen.  When the sleeve was complete the tube was taken off suction and insufflated briefly.  The tube was withdrawn.  Upper endoscopy was then performed by Dr. Ezzard StandingNewman.  Tubular sleeve without bubbles or bleeding.     The specimen was extracted through the 15 trocar site.  Wounds were infiltrated with Exparel  and closed with Monocryl.  The 15 mm port was closed with the endoclose and a 0 vicryl.  Susy Frizzle.    Matt B. Daphine DeutscherMartin, MD, [facility], [place] [phone]

## 2016-05-22 NOTE — Anesthesia Postprocedure Evaluation (Signed)
Anesthesia Post Note  Patient: Felipe DroneShavon G Udovich  Procedure(s) Performed: Procedure(s) (LRB): LAPAROSCOPIC GASTRIC SLEEVE RESECTION, UPPER ENDOSCOPY (N/A)  Patient location during evaluation: PACU Anesthesia Type: General Level of consciousness: awake and sedated Pain management: pain level controlled Vital Signs Assessment: post-procedure vital signs reviewed and stable Respiratory status: spontaneous breathing Cardiovascular status: stable Anesthetic complications: no       Last Vitals:  Vitals:   05/22/16 1730 05/22/16 1745  BP: (!) 155/87 (!) 144/84  Pulse: (!) 102 (!) 111  Resp: (!) 22 (!) 21  Temp: 36.4 C     Last Pain:  Vitals:   05/22/16 1745  TempSrc:   PainSc: 4                  Kenard Morawski

## 2016-05-22 NOTE — Anesthesia Procedure Notes (Signed)
Procedure Name: Intubation Date/Time: 05/22/2016 2:59 PM Performed by: Delphia GratesHANDLER, Amorah Sebring Pre-anesthesia Checklist: Emergency Drugs available, Patient identified, Suction available and Patient being monitored Patient Re-evaluated:Patient Re-evaluated prior to inductionOxygen Delivery Method: Circle system utilized Preoxygenation: Pre-oxygenation with 100% oxygen Intubation Type: IV induction, Cricoid Pressure applied and Rapid sequence Laryngoscope Size: Mac and 4 Grade View: Grade I Tube type: Oral Tube size: 7.5 mm Number of attempts: 1 Airway Equipment and Method: Stylet Placement Confirmation: ETT inserted through vocal cords under direct vision,  positive ETCO2 and breath sounds checked- equal and bilateral Secured at: 22 cm Tube secured with: Tape Dental Injury: Teeth and Oropharynx as per pre-operative assessment

## 2016-05-22 NOTE — Discharge Instructions (Addendum)

## 2016-05-22 NOTE — H&P (Signed)
Taylor DroneShavon G Jimenez 05/16/2016 4:27 PM Location: [facility] Patient #: [id number] DOB: [birth year] Single / Language: [place] / Race: Black or African American Female   History of Present Illness Taylor Jimenez(Jennet Scroggin B. Daphine DeutscherMartin MD; 05/16/2016 5:09 PM) Patient words: Preop for sleeve gastrectomy. No new co morbidities.  The patient is a 35 year old female who presents for a bariatric surgery evaluation. She has had weight issues for her adult life. She has tried numerous medications including Phentermine with limited success. She wants to lose weight and be more active with her sons (9 & 14). She has researched this and is interested in a sleeve gastrectomy. I explained the procedure to her in some detail and she wants to proceed with evaluation. She has seen a counselor and continues to meet with her and she is totally behind this bariatic surgery effort. She does not appear to have OSA. Reflux is not an issue. For sleeve gastrectomy next Tuesday. No problems with GERD.  The patient is a 35 year old female.   Allergies Barron Alvine([name], [place]MA; 05/16/2016 4:27 PM) Cymbalta *ANTIDEPRESSANTS*  Allergies Reconciled   Medication History Timmothy Euler(Sade Bradford, CMA; 05/16/2016 4:29 PM) ClonazePAM (0.5MG  Tablet, Oral daily) Active. Pristiq (100MG  Tablet ER 24HR, Oral) Active. MetFORMIN HCl (500MG  Tablet, Oral daily) Active. Latuda (60MG  Tablet, Oral daily) Active. Phentermine HCl (37.5MG  Capsule, Oral as directed, Taken starting 10/27/2015) Active. ClonazePAM (1MG  Tablet, Oral) Active. Latuda (40MG  Tablet, Oral) Active. Prazosin HCl (2MG  Capsule, Oral) Active. Yasmin 28 (3-0.03MG  Tablet, Oral) Active. Medications Reconciled  Vitals Barron Alvine(Sade Bradford CMA; 05/16/2016 4:30 PM) 05/16/2016 4:29 PM Weight: 409.8 lb Height: 66in Body Surface Area: 2.71 m Body Mass Index: 66.14 kg/m  Temp.: 98.26F  Pulse: 108 (Regular)  BP: 132/78 (Sitting, Left Arm,  Standard)       Physical Exam (Breydan Shillingburg B. Daphine DeutscherMartin MD; 05/16/2016 5:10 PM) The physical exam findings are as follows: Note:HEENT unremarkable Neck supple Chest clear Heart SR without murmurs Abdomen nontender Ext FROM    Assessment & Plan Taylor Jimenez(Dagen Beevers B. Daphine DeutscherMartin MD; 05/16/2016 5:10 PM) OBESITY, MORBID, BMI 50 OR HIGHER (E66.01)  Plan sleeve gastrectomy Story: Two weeks prior to surgery Go on the extremely low carb liquid diet One week prior to surgery No aspirin products. Tylenol is acceptable Stop smoking 24 hours prior to surgery No alcoholic beverages Report fever greater than 100.5 or excessive nasal drainage suggesting infection Continue bariatric preop diet Perform bowel prep if ordered Do not eat or drink anything after midnight the night before surgery Do not take any medications except those instructed by the anesthesiologist Morning of surgery Please arrive at the hospital at least 2 hours before your scheduled surgery time. No makeup, fingernail polish or jewelry Bring insurance cards with you

## 2016-05-22 NOTE — Interval H&P Note (Signed)
History and Physical Interval Note:  05/22/2016 2:28 PM  Taylor Jimenez  has presented today for surgery, with the diagnosis of Morbid Obesity, Diabetes  The various methods of treatment have been discussed with the patient and family. After consideration of risks, benefits and other options for treatment, the patient has consented to  Procedure(s): LAPAROSCOPIC GASTRIC SLEEVE RESECTION, UPPER ENDO (N/A) as a surgical intervention .  The patient's history has been reviewed, patient examined, no change in status, stable for surgery.  I have reviewed the patient's chart and labs.  Questions were answered to the patient's satisfaction.     Shristi Scheib B

## 2016-05-23 ENCOUNTER — Encounter (HOSPITAL_COMMUNITY): Payer: Self-pay | Admitting: Surgery

## 2016-05-23 LAB — CBC WITH DIFFERENTIAL/PLATELET
BASOS PCT: 0 %
Basophils Absolute: 0 10*3/uL (ref 0.0–0.1)
EOS ABS: 0 10*3/uL (ref 0.0–0.7)
EOS PCT: 0 %
HCT: 38.5 % (ref 36.0–46.0)
HEMOGLOBIN: 12.9 g/dL (ref 12.0–15.0)
LYMPHS PCT: 9 %
Lymphs Abs: 1 10*3/uL (ref 0.7–4.0)
MCH: 24.2 pg — AB (ref 26.0–34.0)
MCHC: 33.5 g/dL (ref 30.0–36.0)
MCV: 72.1 fL — ABNORMAL LOW (ref 78.0–100.0)
Monocytes Absolute: 0.4 10*3/uL (ref 0.1–1.0)
Monocytes Relative: 4 %
NEUTROS PCT: 87 %
Neutro Abs: 9.8 10*3/uL — ABNORMAL HIGH (ref 1.7–7.7)
Platelets: 367 10*3/uL (ref 150–400)
RBC: 5.34 MIL/uL — AB (ref 3.87–5.11)
RDW: 14 % (ref 11.5–15.5)
WBC: 11.2 10*3/uL — ABNORMAL HIGH (ref 4.0–10.5)

## 2016-05-23 LAB — GLUCOSE, CAPILLARY
GLUCOSE-CAPILLARY: 158 mg/dL — AB (ref 65–99)
GLUCOSE-CAPILLARY: 120 mg/dL — AB (ref 65–99)
GLUCOSE-CAPILLARY: 111 mg/dL — AB (ref 65–99)
GLUCOSE-CAPILLARY: 118 mg/dL — AB (ref 65–99)
Glucose-Capillary: 121 mg/dL — ABNORMAL HIGH (ref 65–99)

## 2016-05-23 LAB — HEMOGLOBIN AND HEMATOCRIT, BLOOD
HCT: 38 % (ref 36.0–46.0)
HEMOGLOBIN: 12.6 g/dL (ref 12.0–15.0)

## 2016-05-23 MED ORDER — PROMETHAZINE HCL 25 MG/ML IJ SOLN: 12.5000 mg | Freq: Four times a day (QID) | INTRAMUSCULAR | Status: DC | PRN

## 2016-05-23 NOTE — Plan of Care (Signed)
Problem: Food- and Nutrition-Related Knowledge Deficit (NB-1.1) Goal: Nutrition education Formal process to instruct or train a patient/client in a skill or to impart knowledge to help patients/clients voluntarily manage or modify food choices and eating behavior to maintain or improve health. Outcome: Completed/Met Date Met: 05/23/16 Nutrition Education Note  Received consult for diet education per DROP protocol.   Discussed 2 week post op diet with pt. Emphasized that liquids must be non carbonated, non caffeinated, and sugar free. Fluid goals discussed. Reviewed progression of diet to include soft proteins at 7-10 days post-op. Pt to follow up with outpatient bariatric RD for further diet progression after 2 weeks. Multivitamins and minerals also reviewed. Teach back method used, pt expressed understanding, expect good compliance.   Diet: First 2 Weeks  You will see the dietitian about two (2) weeks after your surgery. The dietitian will increase the types of foods you can eat if you are handling liquids well:  If you have severe vomiting or nausea and cannot handle clear liquids lasting longer than 1 day, call your surgeon  Protein Shake  Drink at least 2 ounces of shake 5-6 times per day  Each serving of protein shakes (usually 8 - 12 ounces) should have a minimum of:  15 grams of protein  And no more than 5 grams of carbohydrate  Goal for protein each day:  Men = 80 grams per day  Women = 60 grams per day  Protein powder may be added to fluids such as non-fat milk or Lactaid milk or Soy milk (limit to 35 grams added protein powder per serving)   Hydration  Slowly increase the amount of water and other clear liquids as tolerated (See Acceptable Fluids)  Slowly increase the amount of protein shake as tolerated  Sip fluids slowly and throughout the day  May use sugar substitutes in small amounts (no more than 6 - 8 packets per day; i.e. Splenda)   Fluid Goal  The first goal is to  drink at least 8 ounces of protein shake/drink per day (or as directed by the nutritionist); some examples of protein shakes are [employer], [employer]R [employer], EAS Edge HP, and Unjury. See handout from pre-op Bariatric Education Class:  Slowly increase the amount of protein shake you drink as tolerated  You may find it easier to slowly sip shakes throughout the day  It is important to get your proteins in first  Your fluid goal is to drink 64 - 100 ounces of fluid daily  It may take a few weeks to build up to this  32 oz (or more) should be clear liquids  And  32 oz (or more) should be full liquids (see below for examples)  Liquids should not contain sugar, caffeine, or carbonation   Clear Liquids:  Water or Sugar-free flavored water (i.e. Fruit H2O, Propel)  Decaffeinated coffee or tea (sugar-free)  Crystal Lite, Wyler's Lite, Minute Maid Lite  Sugar-free Jell-O  Bouillon or broth  Sugar-free Popsicle: *Less than 20 calories each; Limit 1 per day   Full Liquids:  Protein Shakes/Drinks + 2 choices per day of other full liquids  Full liquids must be:  No More Than 12 grams of Carbs per serving  No More Than 3 grams of Fat per serving  Strained low-fat cream soup  Non-Fat milk  Fat-free Lactaid Milk  Sugar-free yogurt (Dannon Lite & Fit, Greek yogurt)     Taylor Bibles, MS, RD, LDN Pager: [phone] After Hours Pager: [phone]

## 2016-05-23 NOTE — Progress Notes (Signed)
Patient ID: Taylor Jimenez, female   DOB: Jun 10, 1980, 34 y.o.   MRN: [id number] [facility] Progress Note:   1 Day Post-Op  Subjective: Mental status is alert;  No complaints except incisional pain Objective: Vital signs in last 24 hours: Temp:  [97.5 F (36.4 C)-98.8 F (37.1 C)] 97.9 F (36.6 C) (12/20 0127) Pulse Rate:  [89-112] 89 (12/20 0127) Resp:  [16-26] 18 (12/20 0127) BP: (127-169)/(70-100) 169/100 (12/20 0127) SpO2:  [97 %-100 %] 98 % (12/20 0127) Weight:  [182.3 kg (402 lb)-182.5 kg (402 lb 4 oz)] 182.3 kg (402 lb) (12/19 1323)  Intake/Output from previous day: 12/19 0701 - 12/20 0700 In: 2465 [P.O.:15; I.V.:2350; IV Piggyback:100] Out: 850 [Urine:800; Blood:50] Intake/Output this shift: No intake/output data recorded.  Physical Exam: Work of breathing is normal.  Incisions OK  Lab Results:  Results for orders placed or performed during the hospital encounter of 05/22/16 (from the past 48 hour(s))  Pregnancy, urine STAT morning of surgery     Status: None   Collection Time: 05/22/16  1:15 PM  Result Value Ref Range   Preg Test, Ur NEGATIVE NEGATIVE    Comment:        THE SENSITIVITY OF THIS METHODOLOGY IS >20 mIU/mL.   Glucose, capillary     Status: None   Collection Time: 05/22/16  1:15 PM  Result Value Ref Range   Glucose-Capillary 94 65 - 99 mg/dL   Comment 1 Notify RN   Glucose, capillary     Status: Abnormal   Collection Time: 05/22/16  5:39 PM  Result Value Ref Range   Glucose-Capillary 204 (H) 65 - 99 mg/dL   Comment 1 Document in Chart    Comment 2 Call MD NNP PA CNM   CBC     Status: Abnormal   Collection Time: 05/22/16  6:58 PM  Result Value Ref Range   WBC 17.1 (H) 4.0 - 10.5 K/uL   RBC 5.39 (H) 3.87 - 5.11 MIL/uL   Hemoglobin 12.9 12.0 - 15.0 g/dL   HCT 39.9 36.0 - 46.0 %   MCV 74.0 (L) 78.0 - 100.0 fL   MCH 23.9 (L) 26.0 - 34.0 pg   MCHC 32.3 30.0 - 36.0 g/dL   RDW 14.2 11.5 - 15.5 %   Platelets 346 150 - 400 K/uL   Creatinine, serum     Status: None   Collection Time: 05/22/16  6:58 PM  Result Value Ref Range   Creatinine, Ser 0.77 0.44 - 1.00 mg/dL   GFR calc non Af Amer >60 >60 mL/min   GFR calc Af Amer >60 >60 mL/min    Comment: (NOTE) The eGFR has been calculated using the CKD EPI equation. This calculation has not been validated in all clinical situations. eGFR's persistently <60 mL/min signify possible Chronic Kidney Disease.   Glucose, capillary     Status: Abnormal   Collection Time: 05/22/16  8:15 PM  Result Value Ref Range   Glucose-Capillary 232 (H) 65 - 99 mg/dL  Glucose, capillary     Status: Abnormal   Collection Time: 05/22/16 11:42 PM  Result Value Ref Range   Glucose-Capillary 170 (H) 65 - 99 mg/dL  Glucose, capillary     Status: Abnormal   Collection Time: 05/23/16  4:25 AM  Result Value Ref Range   Glucose-Capillary 158 (H) 65 - 99 mg/dL  CBC WITH DIFFERENTIAL     Status: Abnormal   Collection Time: 05/23/16  4:53 AM  Result Value Ref Range  WBC 11.2 (H) 4.0 - 10.5 K/uL   RBC 5.34 (H) 3.87 - 5.11 MIL/uL   Hemoglobin 12.9 12.0 - 15.0 g/dL   HCT 38.5 36.0 - 46.0 %   MCV 72.1 (L) 78.0 - 100.0 fL   MCH 24.2 (L) 26.0 - 34.0 pg   MCHC 33.5 30.0 - 36.0 g/dL   RDW 14.0 11.5 - 15.5 %   Platelets 367 150 - 400 K/uL   Neutrophils Relative % 87 %   Lymphocytes Relative 9 %   Monocytes Relative 4 %   Eosinophils Relative 0 %   Basophils Relative 0 %   Neutro Abs 9.8 (H) 1.7 - 7.7 K/uL   Lymphs Abs 1.0 0.7 - 4.0 K/uL   Monocytes Absolute 0.4 0.1 - 1.0 K/uL   Eosinophils Absolute 0.0 0.0 - 0.7 K/uL   Basophils Absolute 0.0 0.0 - 0.1 K/uL   Smear Review MORPHOLOGY UNREMARKABLE   Glucose, capillary     Status: Abnormal   Collection Time: 05/23/16  7:55 AM  Result Value Ref Range   Glucose-Capillary 120 (H) 65 - 99 mg/dL    Radiology/Results: No results found.  Anti-infectives: Anti-infectives    Start     Dose/Rate Route Frequency Ordered Stop   05/23/16 0600   cefoTEtan in Dextrose 5% (CEFOTAN) IVPB 2 g     2 g Intravenous On call to O.R. 05/22/16 1311 05/22/16 1506      Assessment/Plan: Problem List: Patient Active Problem List   Diagnosis Date Noted  . 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   1 Day Post-Op    LOS: 1 day   Matt B. Hassell Done, MD, [facility] [phone] beeper [phone]  05/23/2016 8:20 AM

## 2016-05-23 NOTE — Progress Notes (Signed)
Post op nausea has resolved.  Patient has started water and has not advanced to protein shakes. Pt is ambulating in hallway ans using Incentive.  Will continue to monitor.    Taylor Jimenez

## 2016-05-24 LAB — CBC WITH DIFFERENTIAL/PLATELET
BASOS PCT: 0 %
Basophils Absolute: 0 10*3/uL (ref 0.0–0.1)
EOS PCT: 0 %
Eosinophils Absolute: 0 10*3/uL (ref 0.0–0.7)
HEMATOCRIT: 37.2 % (ref 36.0–46.0)
Hemoglobin: 12.2 g/dL (ref 12.0–15.0)
LYMPHS ABS: 3.5 10*3/uL (ref 0.7–4.0)
Lymphocytes Relative: 26 %
MCH: 24.1 pg — AB (ref 26.0–34.0)
MCHC: 32.8 g/dL (ref 30.0–36.0)
MCV: 73.4 fL — AB (ref 78.0–100.0)
MONO ABS: 0.9 10*3/uL (ref 0.1–1.0)
MONOS PCT: 7 %
Neutro Abs: 8.9 10*3/uL — ABNORMAL HIGH (ref 1.7–7.7)
Neutrophils Relative %: 67 %
PLATELETS: 345 10*3/uL (ref 150–400)
RBC: 5.07 MIL/uL (ref 3.87–5.11)
RDW: 14.4 % (ref 11.5–15.5)
WBC: 13.3 10*3/uL — ABNORMAL HIGH (ref 4.0–10.5)

## 2016-05-24 LAB — GLUCOSE, CAPILLARY
GLUCOSE-CAPILLARY: 113 mg/dL — AB (ref 65–99)
Glucose-Capillary: 116 mg/dL — ABNORMAL HIGH (ref 65–99)
Glucose-Capillary: 144 mg/dL — ABNORMAL HIGH (ref 65–99)

## 2016-05-24 NOTE — Progress Notes (Signed)
Patient resting in bed with no complaints.  Patient has received discharge instructions and is waiting for family to take her home.

## 2016-05-24 NOTE — Discharge Summary (Signed)
Physician Discharge Summary  Patient ID: Taylor DroneShavon G Moylan MRN: [id number] DOB/AGE: 11-09-1980 35 y.o.  Admit date: 05/22/2016 Discharge date: 05/24/2016  Admission Diagnoses:  Morbid obesity BMI >60  Discharge Diagnoses:  same  Principal Problem:   S/P laparoscopic sleeve gastrectomy Dec 2017   Surgery:  Sleeve gastrectomy  Discharged Condition: improved  Hospital Course:   Had surgery on Tuesday.  Began drinking and progressed slowly until ready for discharge on Thursday  Consults: none  Significant Diagnostic Studies: none    Discharge Exam: Blood pressure 132/76, pulse 71, temperature 98.2 F (36.8 C), temperature source Oral, resp. rate 19, height 5\' 6"  (1.676 m), weight (!) 182.3 kg (402 lb), last menstrual period 05/13/2016, SpO2 100 %. Incisions OK  Disposition: 01-Home or Self Care  Discharge Instructions    Ambulate hourly while awake    Complete by:  As directed    Call MD for:  difficulty breathing, headache or visual disturbances    Complete by:  As directed    Call MD for:  persistant dizziness or light-headedness    Complete by:  As directed    Call MD for:  persistant nausea and vomiting    Complete by:  As directed    Call MD for:  redness, tenderness, or signs of infection (pain, swelling, redness, odor or green/yellow discharge around incision site)    Complete by:  As directed    Call MD for:  severe uncontrolled pain    Complete by:  As directed    Call MD for:  temperature >101 F    Complete by:  As directed    Diet bariatric full liquid    Complete by:  As directed    Incentive spirometry    Complete by:  As directed    Perform hourly while awake     Allergies as of 05/24/2016      Reactions   Cymbalta [duloxetine Hcl] Rash      Medication List    TAKE these medications   albuterol 108 (90 Base) MCG/ACT inhaler Commonly known as:  PROVENTIL HFA;VENTOLIN HFA Inhale 2 puffs into the lungs every 4 (four) hours as needed for  wheezing.   clonazePAM 0.5 MG tablet Commonly known as:  KLONOPIN TAKE 1 TABLET BY MOUTH AT BEDTIME   desvenlafaxine 100 MG 24 hr tablet Commonly known as:  PRISTIQ Take 100 mg by mouth daily.   drospirenone-ethinyl estradiol 3-0.03 MG tablet Commonly known as:  YASMIN,ZARAH,SYEDA Take 1 tablet by mouth every evening.   LATUDA 60 MG Tabs Generic drug:  Lurasidone HCl Take by mouth every evening.   lisdexamfetamine 60 MG capsule Commonly known as:  VYVANSE Take 60 mg by mouth every morning.   metFORMIN 500 MG tablet Commonly known as:  GLUCOPHAGE Take 500 mg by mouth daily with breakfast.   Pseudoeph-Chlorphen-Hydrocod 60-4-5 MG/5ML Soln Take 5 mLs by mouth 4 (four) times daily as needed.   TUSSIONEX PENNKINETIC ER 10-8 MG/5ML Lqcr Generic drug:  chlorpheniramine-HYDROcodone Take 5 mLs by mouth.      Follow-up Information    Valarie MerinoMARTIN,Ashlon Lottman B, MD Follow up on 06/06/2016.   Specialty:  General Surgery Why:  follow up at 3:30 Contact information: [address] [place] [address] [phone]        Valarie MerinoMARTIN,Lydell Moga B, MD Follow up.   Specialty:  General Surgery Contact information: [address] [place] [address] [phone]           Signed: Luretha MurphyMARTIN,Tyreck Bell  B 05/24/2016, 11:24 AM

## 2016-05-24 NOTE — Progress Notes (Signed)
Patient alert and oriented, pain is controlled. Patient is tolerating fluids, advanced to protein shake, patient is tolerating well.  Reviewed Gastric sleeve discharge instructions with patient and patient is able to articulate understanding.  Provided information on BELT program, Support Group and [facility] outpatient pharmacy. All questions answered, will continue to monitor.  Taylor Jimenez

## 2016-06-05 ENCOUNTER — Encounter: Payer: BLUE CROSS/BLUE SHIELD | Attending: Surgery | Admitting: Dietician

## 2016-06-05 ENCOUNTER — Telehealth (HOSPITAL_COMMUNITY): Payer: Self-pay

## 2016-06-05 DIAGNOSIS — Z713 Dietary counseling and surveillance: Secondary | ICD-10-CM | POA: Diagnosis present

## 2016-06-05 DIAGNOSIS — Z6841 Body Mass Index (BMI) 40.0 and over, adult: Secondary | ICD-10-CM | POA: Insufficient documentation

## 2016-06-05 NOTE — Telephone Encounter (Addendum)
Made discharge phone call to patient asking the following questions.    1. Do you have someone to care for you now that you are home?  Yes 2. Are you having pain now that is not relieved by your pain medication?  No 3. Are you able to drink the recommended daily amount of fluids (48 ounces minimum/day) and protein (60-80 grams/day) as prescribed by the dietitian or nutritional counselor?  Patient stated that she can not remember to drink as much as she should, Pt advised to set reminders in her phone.  Patient reeducated on the Vit/protein schedule.   4. Are you taking the vitamins and minerals as prescribed? Yes  5. Do you have the "on call" number to contact your surgeon if you have a problem or question?  Yes 6. Are your incisions free of redness, swelling or drainage? (If steri strips, address that these can fall off, shower as tolerated) Yes 7. Have your bowels moved since your surgery?  If not, are you passing gas?  Yes 8. Are you up and walking 3-4 times per day?  Yes 9. Were you provided your discharge medications before your surgery or before you were discharged from the hospital and are you taking them without problem?  Yes   Tama Grosz RN

## 2016-06-05 NOTE — Progress Notes (Signed)
Bariatric Class:  Appt start time: 400 end time:  500  2 Week Post-Operative Nutrition Class  Patient was seen on 06/05/2016 for Post-Operative Nutrition education at [facility].   Surgery date: 05/22/2016 Surgery type: sleeve gastrectomy Start weight at [facility]: 413 lbs on 10/18/2015 Weight today: 397.2 lbs Weight change: 12 lbs Total weight lost: 16 lbs  TANITA  BODY COMP RESULTS  05/14/16 06/05/16   BMI (kg/m^2) n/a 64.1   Fat Mass (lbs)  236   Fat Free Mass (lbs)  161.2   Total Body Water (lbs)  n/a    The following the learning objectives were met by the patient during this course:  Identifies Phase 3A (Soft, High Proteins) Dietary Goals and will begin from 2 weeks post-operatively to 2 months post-operatively  Identifies appropriate sources of fluids and proteins   States protein recommendations and appropriate sources post-operatively  Identifies the need for appropriate texture modifications, mastication, and bite sizes when consuming solids  Identifies appropriate multivitamin and calcium sources post-operatively  Describes the need for physical activity post-operatively and will follow MD recommendations  States when to call healthcare provider regarding medication questions or post-operative complications  Handouts given during class include:  Phase 3A: Soft, High Protein Diet Handout  Band Fill Guidelines Handout  Follow-Up Plan: Patient will follow-up at [facility] in 4 weeks for 6 week post-op nutrition visit for diet advancement per MD.

## 2016-06-06 ENCOUNTER — Encounter: Payer: Self-pay | Admitting: Dietician

## 2016-06-22 ENCOUNTER — Ambulatory Visit (INDEPENDENT_AMBULATORY_CARE_PROVIDER_SITE_OTHER): Payer: BLUE CROSS/BLUE SHIELD

## 2016-06-22 ENCOUNTER — Ambulatory Visit (HOSPITAL_COMMUNITY)
Admission: EM | Admit: 2016-06-22 | Discharge: 2016-06-22 | Disposition: A | Discharge status: Home / Self Care | Payer: BLUE CROSS/BLUE SHIELD | Attending: Family Medicine | Admitting: Family Medicine

## 2016-06-22 ENCOUNTER — Encounter (HOSPITAL_COMMUNITY): Payer: Self-pay | Admitting: Emergency Medicine

## 2016-06-22 DIAGNOSIS — S99922A Unspecified injury of left foot, initial encounter: Secondary | ICD-10-CM

## 2016-06-22 MED ORDER — HYDROCODONE-ACETAMINOPHEN 5-325 MG PO TABS: 1.0000 | ORAL_TABLET | ORAL | 0 refills | Status: AC | PRN

## 2016-06-22 NOTE — Discharge Instructions (Signed)
Wear boot and activity as tolerated, see orthopedist next week for recheck.

## 2016-06-22 NOTE — ED Provider Notes (Signed)
[facility]    CSN: [id number] Arrival date & time: 06/22/16  1241     History   Chief Complaint Chief Complaint  Patient presents with  . Foot Injury    HPI Taylor Jimenez is a 36 y.o. female.    Foot Injury    Past Medical History:  Diagnosis Date  . Depression   . Diabetes mellitus without complication (HCC)    type 2  . Headache    migraines   . Ligament tear    left knee    Patient Active Problem List   Diagnosis Date Noted  . 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 colonscopy    . LAPAROSCOPIC GASTRIC SLEEVE RESECTION N/A 05/22/2016   Procedure: LAPAROSCOPIC GASTRIC SLEEVE RESECTION, UPPER ENDOSCOPY;  Surgeon: Luretha MurphyMatthew Martin, MD;  Location: [facility];  Service: General;  Laterality: N/A;  . wisdom and molar extraction      OB History    No data available       Home Medications    Prior to Admission medications   Medication Sig Start Date End Date Taking? Authorizing Provider  clonazePAM (KLONOPIN) 0.5 MG tablet TAKE 1 TABLET BY MOUTH AT BEDTIME 04/16/16  Yes Historical Provider, MD  desvenlafaxine (PRISTIQ) 100 MG 24 hr tablet Take 100 mg by mouth daily.   Yes Historical Provider, MD  drospirenone-ethinyl estradiol (YASMIN,ZARAH,SYEDA) 3-0.03 MG tablet Take 1 tablet by mouth every evening.    Yes Historical Provider, MD  lisdexamfetamine (VYVANSE) 60 MG capsule Take 60 mg by mouth every morning.   Yes Historical Provider, MD  Lurasidone HCl (LATUDA) 60 MG TABS Take by mouth every evening.    Yes Historical Provider, MD  oxyCODONE-acetaminophen (PERCOCET/ROXICET) 5-325 MG tablet Take by mouth every 4 (four) hours as needed for severe pain.   Yes Historical Provider, MD  albuterol (PROVENTIL HFA;VENTOLIN HFA) 108 (90 BASE) MCG/ACT inhaler Inhale 2 puffs into the lungs every 4 (four) hours as needed for wheezing. Patient not taking: Reported on 05/14/2016 10/30/12    Graylon GoodZachary H Baker, PA-C  chlorpheniramine-HYDROcodone (TUSSIONEX PENNKINETIC ER) 10-8 MG/5ML LQCR Take 5 mLs by mouth.    Historical Provider, MD  metFORMIN (GLUCOPHAGE) 500 MG tablet Take 500 mg by mouth daily with breakfast.     Historical Provider, MD  Pseudoeph-Chlorphen-Hydrocod 60-4-5 MG/5ML SOLN Take 5 mLs by mouth 4 (four) times daily as needed. Patient not taking: Reported on 05/14/2016 10/30/12   Graylon GoodZachary H Baker, PA-C    Family History History reviewed. No pertinent family history.  Social History Social History  Substance Use Topics  . Smoking status: Former Smoker    Packs/day: 0.25    Years: 1.00    Quit date: 06/04/2013  . Smokeless tobacco: Never Used  . Alcohol use Yes     Comment: occ/rare     Allergies   Cymbalta [duloxetine hcl]   Review of Systems Review of Systems   Physical Exam Triage Vital Signs ED Triage Vitals  Enc Vitals Group     BP 06/22/16 1458 115/83     Pulse Rate 06/22/16 1458 92     Resp 06/22/16 1458 20     Temp 06/22/16 1458 99.5 F (37.5 C)     Temp Source 06/22/16 1458 Oral     SpO2 06/22/16 1458 98 %     Weight --      Height --      Head Circumference --  Peak Flow --      Pain Score 06/22/16 1500 8     Pain Loc --      Pain Edu? --      Excl. in GC? --    No data found.   Updated Vital Signs BP 115/83 (BP Location: Left Arm)   Pulse 92   Temp 99.5 F (37.5 C) (Oral)   Resp 20   LMP 06/22/2016   SpO2 98%   Visual Acuity Right Eye Distance:   Left Eye Distance:   Bilateral Distance:    Right Eye Near:   Left Eye Near:    Bilateral Near:     Physical Exam   UC Treatments / Results  Labs (all labs ordered are listed, but only abnormal results are displayed) Labs Reviewed - No data to display  EKG  EKG Interpretation None       Radiology Dg Ankle Complete Left  Result Date: 06/22/2016 CLINICAL DATA:  Slipped on an Ace.  Generalized ankle pain. EXAM: LEFT ANKLE COMPLETE - 3+ VIEW COMPARISON:   None. FINDINGS: There is an acute avulsion fracture at the anterior dorsal corner of the navicular . No other regional fracture. Venous soft tissue calcifications are noted. IMPRESSION: Dorsal navicular avulsion fracture. Electronically Signed   By: Paulina Fusi M.D.   On: 06/22/2016 15:22  X-rays reviewed and report per radiologist.   Procedures Procedures (including critical care time)  Medications Ordered in UC Medications - No data to display   Initial Impression / Assessment and Plan / UC Course  I have reviewed the triage vital signs and the nursing notes.  Pertinent labs & imaging results that were available during my care of the patient were reviewed by me and considered in my medical decision making (see chart for details).       Final Clinical Impressions(s) / UC Diagnoses   Final diagnoses:  None    New Prescriptions New Prescriptions   No medications on file     Linna Hoff, MD 07/04/16 2056

## 2016-06-22 NOTE — ED Triage Notes (Signed)
Pt reports she twisted left foot yest... States she slipped on ice/snow  Also states she heard a "pop"  Sx today include: swelling and pain that increases w/activity  Brought back on wheelchair...   A&O x4... NAD

## 2016-06-28 ENCOUNTER — Ambulatory Visit (INDEPENDENT_AMBULATORY_CARE_PROVIDER_SITE_OTHER): Payer: BLUE CROSS/BLUE SHIELD | Admitting: Orthopaedic Surgery

## 2016-06-28 ENCOUNTER — Encounter (INDEPENDENT_AMBULATORY_CARE_PROVIDER_SITE_OTHER): Payer: Self-pay | Admitting: Orthopaedic Surgery

## 2016-06-28 DIAGNOSIS — S92252A Displaced fracture of navicular [scaphoid] of left foot, initial encounter for closed fracture: Secondary | ICD-10-CM | POA: Diagnosis not present

## 2016-06-28 NOTE — Progress Notes (Signed)
   Office Visit Note   Patient: Taylor Jimenez           Date of Birth: 08/24/1980           MRN: [id number] Visit Date: 06/28/2016              Requested by: Lewis MoccasinElizabeth R Dewey, MD [address] [place], [place] [id number] PCP: Maryelizabeth RowanEWEY,ELIZABETH, MD   Assessment & Plan: Visit Diagnoses: No diagnosis found.  Plan: Left navicular avulsion fracture. Wean cam boot over the next 3-4 weeks. Increase activity as tolerated.  Follow-Up Instructions: Return if symptoms worsen or fail to improve.   Orders:  No orders of the defined types were placed in this encounter.  No orders of the defined types were placed in this encounter.     Procedures: No procedures performed   Clinical Data: No additional findings.   Subjective: Chief Complaint  Patient presents with  . Left Ankle - Injury    Patient is 36 year old female who sustained a left foot injury on ice on 06/22/2016. She's been wearing a fracture boot. Pain is worse with weightbearing is 2 out of 10. She is currently working. She sustained a avulsion fracture of the navicular. Pain does not radiate.    Review of Systems Complete review of systems negative except for history of present illness  Objective: Vital Signs: LMP 06/22/2016   Physical Exam  Constitutional: She is oriented to person, place, and time. She appears well-developed and well-nourished.  HENT:  Head: Normocephalic and atraumatic.  Eyes: EOM are normal.  Neck: Neck supple.  Pulmonary/Chest: Effort normal.  Abdominal: Soft.  Neurological: She is alert and oriented to person, place, and time.  Skin: Skin is warm. Capillary refill takes less than 2 seconds.  Psychiatric: She has a normal mood and affect. Her behavior is normal. Judgment and thought content normal.  Nursing note and vitals reviewed.   Ortho Exam Exam of the left foot shows tenderness over the navicular bone. She has mild swelling. Foot is neurovascularly intact. No orders  features. Specialty Comments:  No specialty comments available.  Imaging: No results found.   PMFS History: Patient Active Problem List   Diagnosis Date Noted  . 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 Depression   . Diabetes mellitus without complication (HCC)    type 2  . Headache    migraines   . Ligament tear    left knee    No family history on file.  Past Surgical History:  Procedure Laterality Date  . colonscopy    . LAPAROSCOPIC GASTRIC SLEEVE RESECTION N/A 05/22/2016   Procedure: LAPAROSCOPIC GASTRIC SLEEVE RESECTION, UPPER ENDOSCOPY;  Surgeon: Luretha MurphyMatthew Martin, MD;  Location: [facility];  Service: General;  Laterality: N/A;  . wisdom and molar extraction     Social History   Occupational History  . Not on file.   Social History Main Topics  . Smoking status: Former Smoker    Packs/day: 0.25    Years: 1.00    Quit date: 06/04/2013  . Smokeless tobacco: Never Used  . Alcohol use Yes     Comment: occ/rare  . Drug use: No  . Sexual activity: Not on file

## 2016-07-18 ENCOUNTER — Ambulatory Visit: Payer: BLUE CROSS/BLUE SHIELD | Admitting: Dietician

## 2016-08-15 ENCOUNTER — Encounter: Payer: Self-pay | Admitting: Skilled Nursing Facility1

## 2016-08-15 ENCOUNTER — Encounter: Payer: BLUE CROSS/BLUE SHIELD | Attending: Surgery | Admitting: Skilled Nursing Facility1

## 2016-08-15 DIAGNOSIS — Z713 Dietary counseling and surveillance: Secondary | ICD-10-CM | POA: Diagnosis present

## 2016-08-15 DIAGNOSIS — Z6841 Body Mass Index (BMI) 40.0 and over, adult: Secondary | ICD-10-CM | POA: Insufficient documentation

## 2016-08-15 DIAGNOSIS — E669 Obesity, unspecified: Secondary | ICD-10-CM

## 2016-08-15 NOTE — Progress Notes (Signed)
  Medical Nutrition Therapy:  Appt start time: 8:05 end time: 8:30  Primary concerns today: Post-operative Bariatric Surgery Nutrition Management. Pt states she is 368 pounds. Pt refused the scale and has not been following the foundation of the post-op diet: pt states she has not been following the recommendations because she was just tired of it and having limited options.  Surgery date: 05/22/2016 Surgery type: sleeve gastrectomy [facility] lbs on 10/18/2015 Weight today: 397.2 lbs Weight change: 12 lbs Total weight lost: 16 lbs  TANITA  BODY COMP RESULTS  05/14/16 06/05/16   BMI (kg/m^2) n/a 64.1   Fat Mass (lbs)  236   Fat Free Mass (lbs)  161.2   Total Body Water (lbs)  n/a     24-hr recall: B (AM): oatmeal Snk (AM):  L (PM): chicken, kale, green beans (3-4 oz: 21g) Snk (PM): greek yogurt (12g) D (PM): baked white fish with sweet potato sometimes a vegetable but not often (21 g) Snk (PM):   Fluid intake: sweet tea (24 ounces), 33.8 water, gingerale: 57.8 ounces of fluid Estimated total protein intake: 54 g  Medications: see list Supplementation: not been taking   CBG monitoring: no, never checked   Using straws: sometimes: no issue Drinking while eating: most of the time with vomiting  Having you been chewing well: no Chewing/swallowing difficulties: no Changes in vision:  no Changes to mood/headaches:  No, irritable was like that before surgery Hair loss/Changes to skin/Changes to nails: a little, no, no Any difficulty focusing or concentrating: no Sweating: no Dizziness/Lightheaded: no Palpitations: a few times 2 days ago: just sitting Carbonated beverages: sometimes regular soda N/V/D/C/GAS: no, yes, no, no, no Abdominal Pain: no  Recent physical activity:  ADL's, stopped due to fracturing ankle: will get back to the elliptical   Progress Towards Goal(s):  In progress.   Nutritional Diagnosis:  [place]-3.3 Overweight/obesity related to past  poor dietary habits and physical inactivity as evidenced by patient w/ recent sleeve gastrectomy surgery following dietary guidelines for continued weight loss.    Intervention:  Nutrition counseling. Dietitian educated the pt on the necessity of following the diet recommendations. Goals: -Aim for 60 grams of protein every day -Aim for 64 fluid ounces every day -Start taking your multi and calcium again -Get back to the Elliptical  -Low Blood sugar=shaky, sweaty, extreme fatigue possibly extreme hunger, if you feel any of these check your sugar and if it is under 70 then have some sort of carbohydrate  Teaching Method Utilized:  Visual Auditory Hands on  Barriers to learning/adherence to lifestyle change: feelings of the diet not having enough variety and not seeing the wt loss fast enough  Demonstrated degree of understanding via:  Teach Back   Monitoring/Evaluation:  Dietary intake, exercise, lap band fills, and body weight.

## 2016-08-15 NOTE — Patient Instructions (Addendum)
-  Aim for 60 grams of protein every day  -Aim for 64 fluid ounces every day  -Start taking your multi and calcium again  -Get back to the Elliptical   -Low Blood sugar=shaky, sweaty, extreme fatigue possibly extreme hunger, if you feel any of these check your sugar and if it is under 70 then have some sort of carbohydrate

## 2016-09-25 ENCOUNTER — Ambulatory Visit: Payer: BLUE CROSS/BLUE SHIELD | Admitting: Skilled Nursing Facility1

## 2017-06-13 ENCOUNTER — Other Ambulatory Visit: Payer: Self-pay | Admitting: Surgery

## 2017-06-13 DIAGNOSIS — K219 Gastro-esophageal reflux disease without esophagitis: Secondary | ICD-10-CM

## 2017-06-14 ENCOUNTER — Other Ambulatory Visit: Payer: BLUE CROSS/BLUE SHIELD

## 2017-08-06 ENCOUNTER — Other Ambulatory Visit: Payer: BLUE CROSS/BLUE SHIELD

## 2017-08-16 ENCOUNTER — Other Ambulatory Visit: Payer: BLUE CROSS/BLUE SHIELD

## 2018-01-08 IMAGING — DX DG CHEST 2V
2 series · 2 of 2 positions shown · non-contrast
Comparison: Chest x-ray of October 30, 2012

CLINICAL DATA: Pre bariatric surgery evaluation, morbid obesity,
current smoker.

EXAM:
CHEST  2 VIEW

[chest pa]
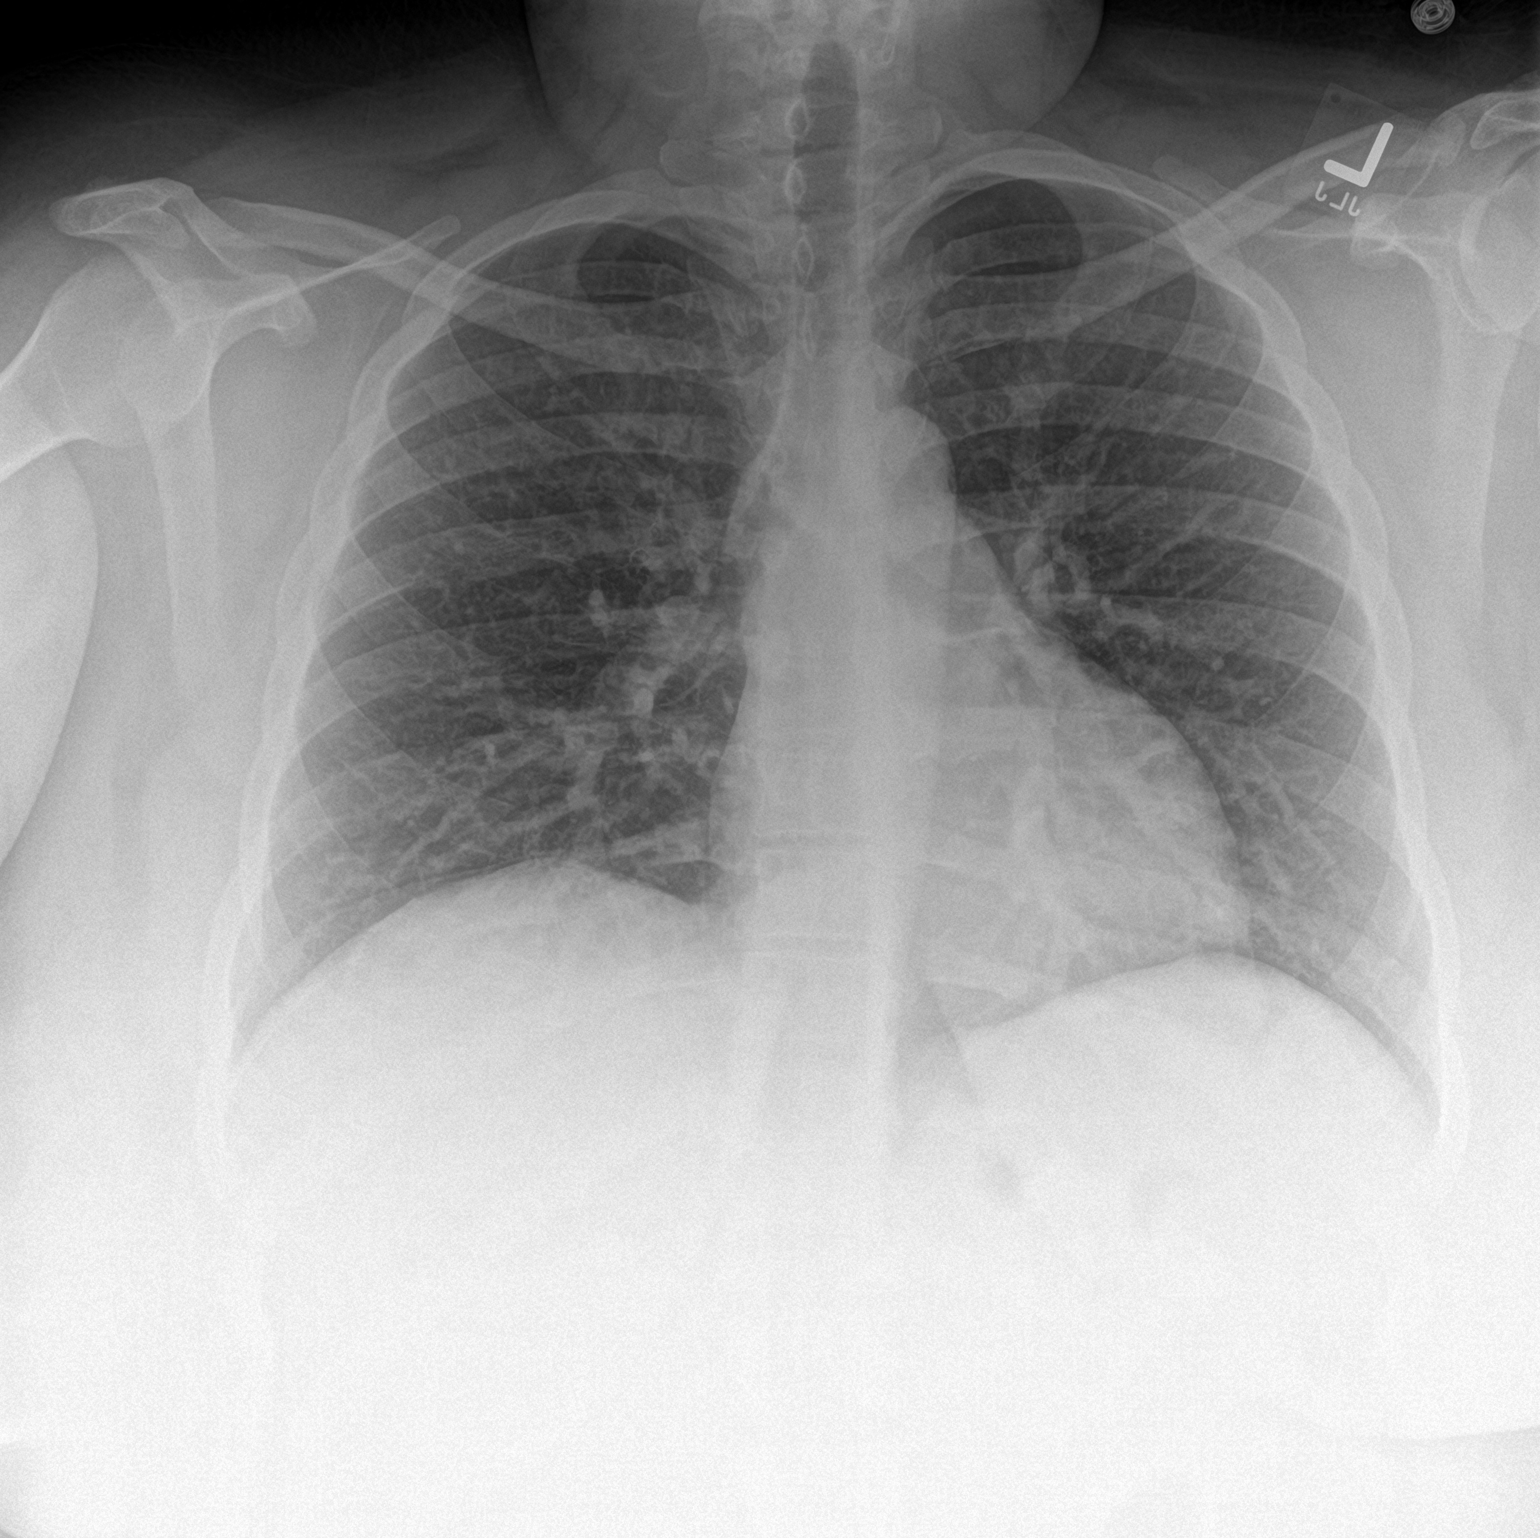

[chest lat]
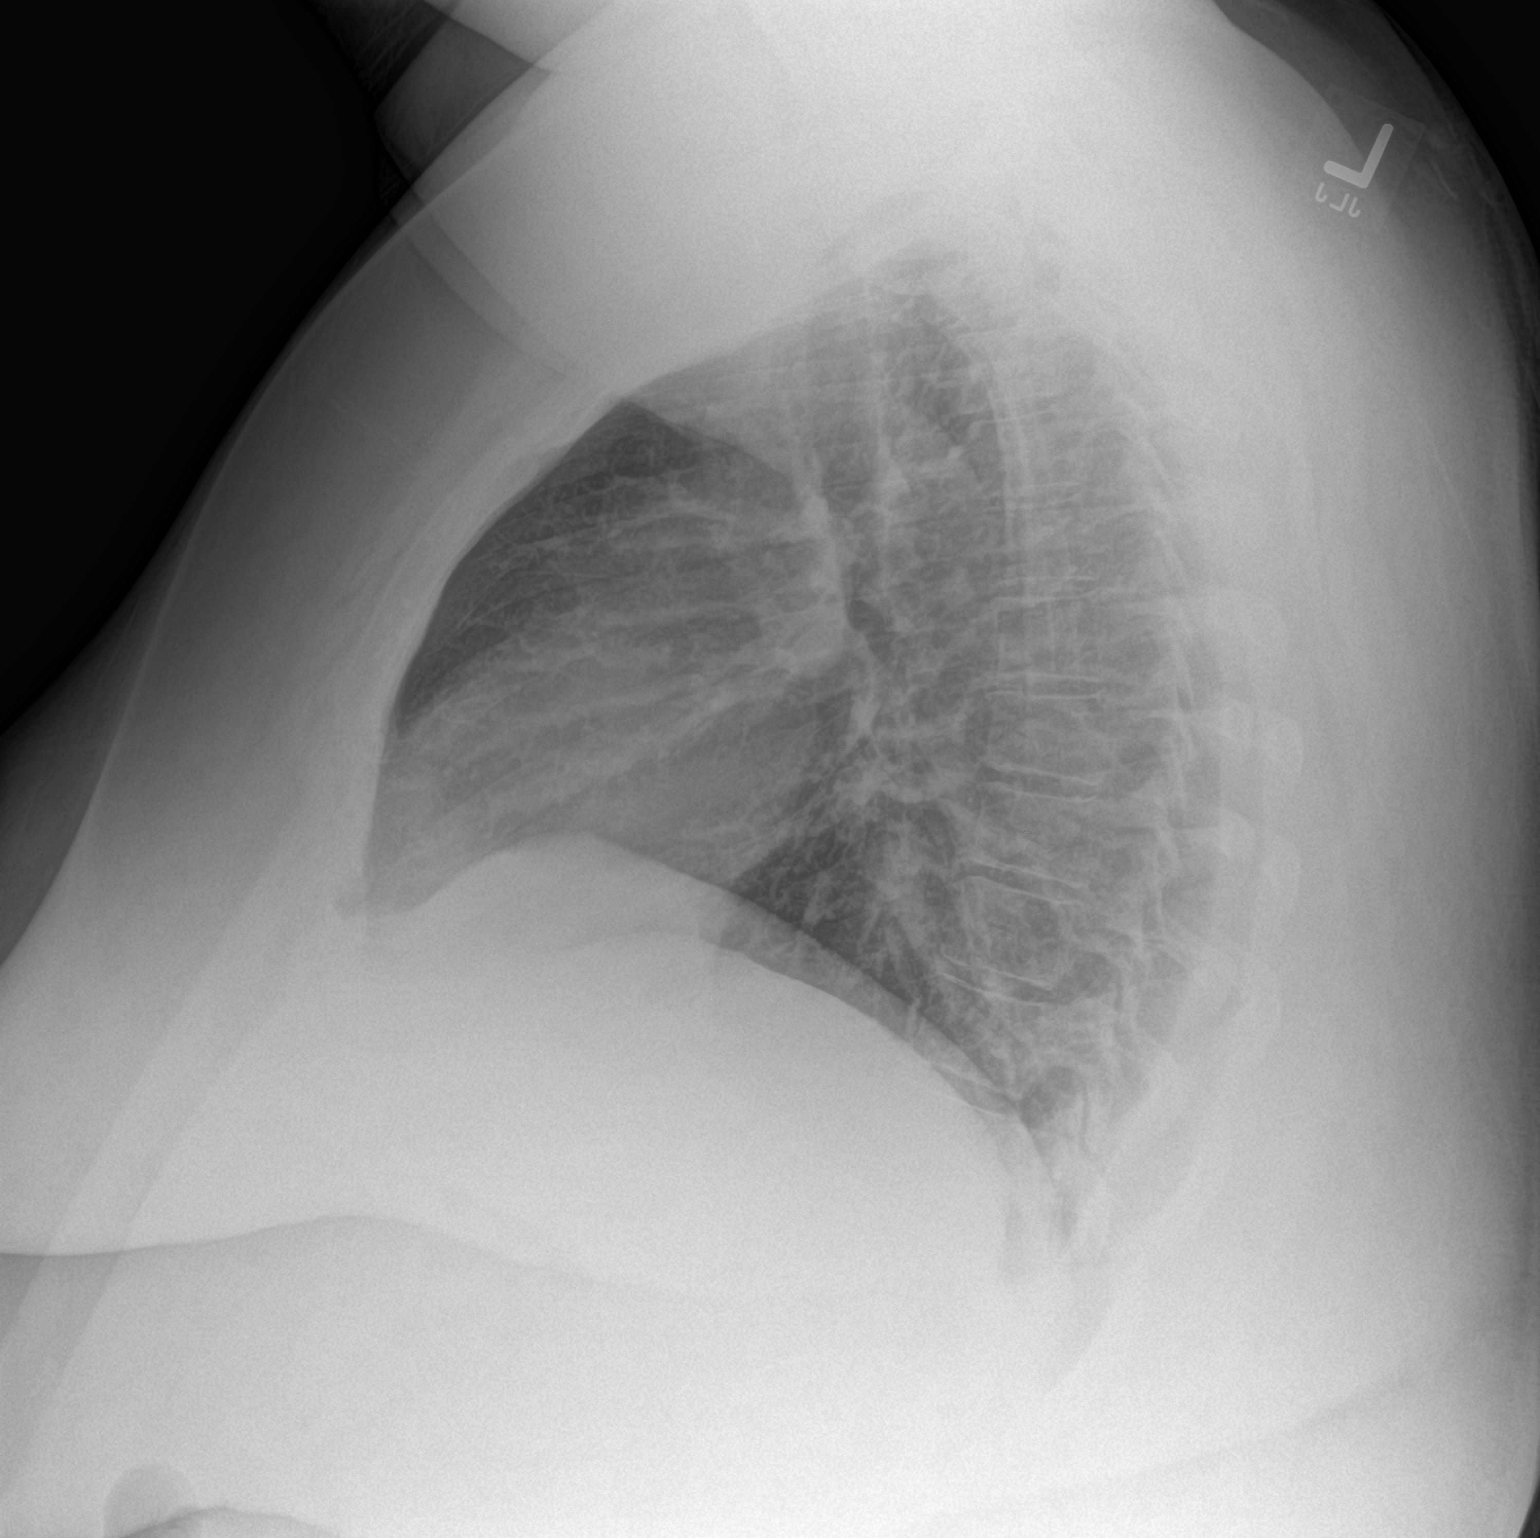

[2 of 2 positions shown; findings below may reference images not displayed]

FINDINGS: The lungs are better inflated today. The interstitial markings are
coarse. There is no alveolar infiltrate. There is no pleural
effusion. The heart and pulmonary vascularity are normal. The
mediastinum is normal in width. The observed bony thorax is
unremarkable.
IMPRESSION: Mild interstitial prominence may reflect the patient's smoking
history. There is no pneumonia, CHF, nor other acute cardiopulmonary
abnormality.

## 2018-01-08 IMAGING — RF DG UGI W/ KUB
7 series · 12 of 13 positions shown · non-contrast
Comparison: None.

CLINICAL DATA: Preop for gastric sleeve procedure.

EXAM:
UPPER GI SERIES WITH KUB
TECHNIQUE: After obtaining a scout radiograph a routine upper GI series was
performed using in barium
FLUOROSCOPY TIME:  Radiation Exposure Index (as provided by the
fluoroscopic device): 89.40 mGy
If the device does not provide the exposure index:
Fluoroscopy Time (in minutes and seconds): 1 minutes and 40 seconds.
Number of Acquired Images:

[Series 1: t abdomen supine · 0.15mm/px · 1 of 1 slices shown]
[im 1/1]
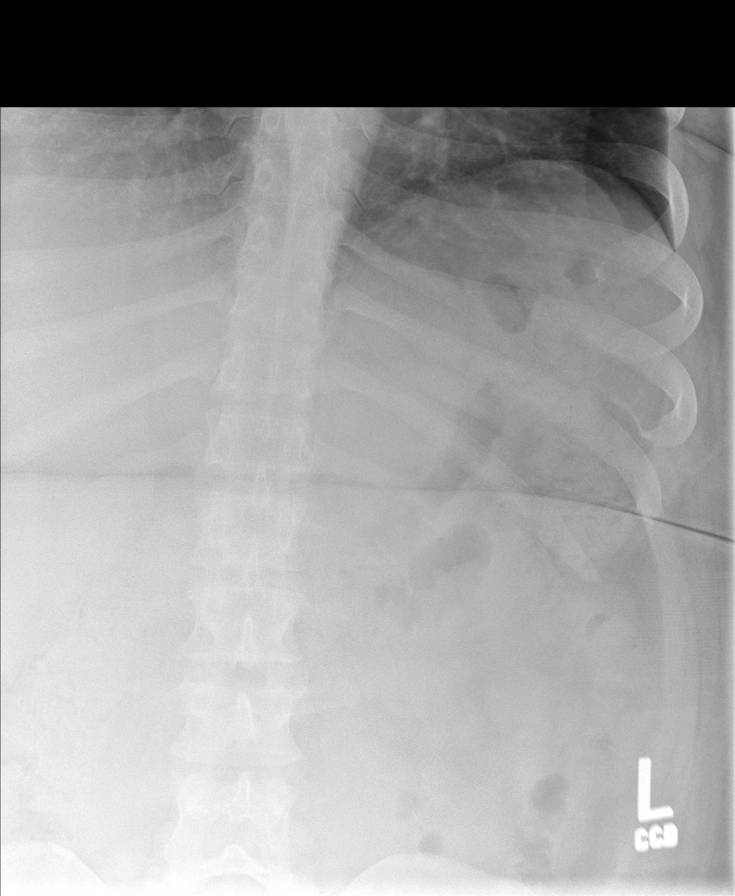

[Series 2: cp_standard · 0.51mm/px · 4 of 67 frames shown (1 of 6)]
[frame 1/67]
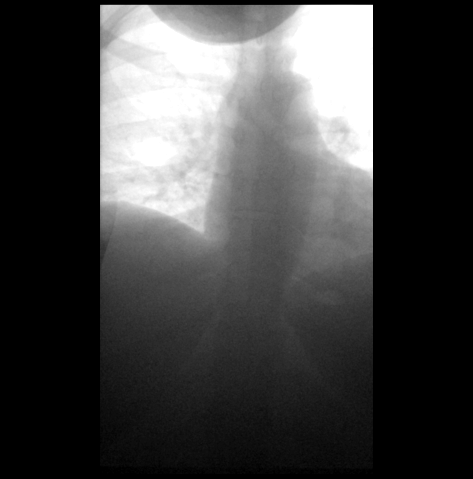
[frame 11/67]
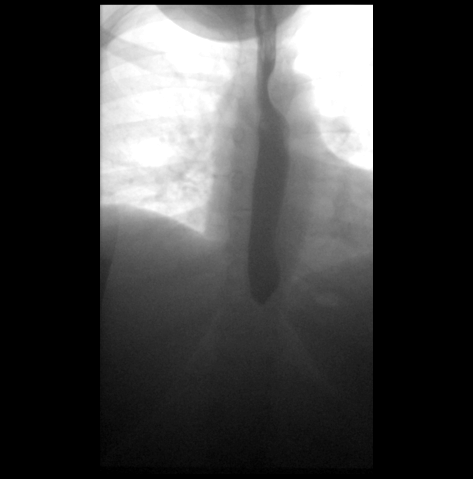
[frame 34/67]
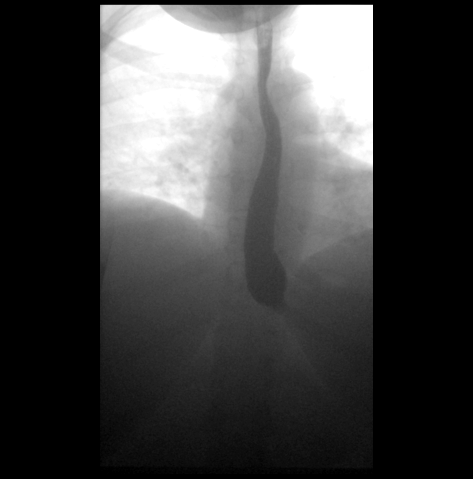
[frame 57/67]
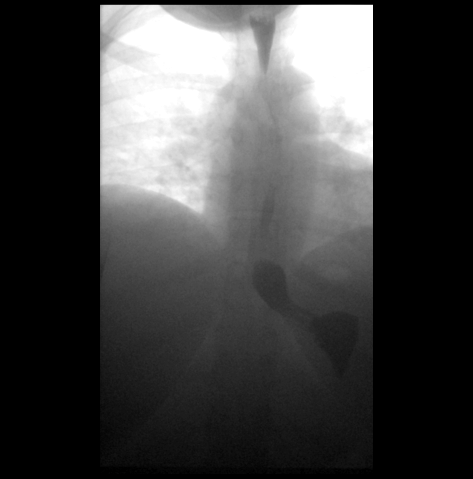

[Series 3: cp_standard · 0.51mm/px · 3 of 149 frames shown (2 of 6)]
[frame 23/149]
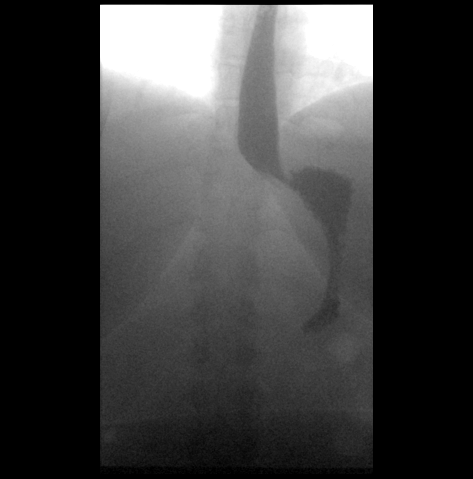
[frame 80/149]
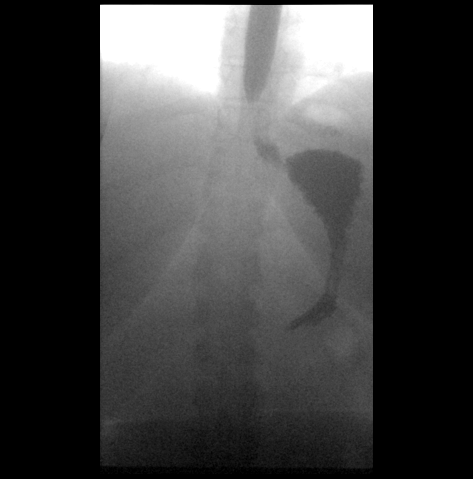
[frame 127/149]
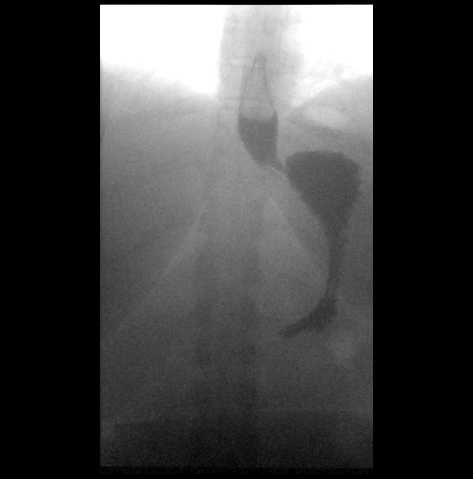

[Series 4: cp_standard · 0.25mm/px · 1 of 1 slices shown (3 of 6)]
[im 1/1]
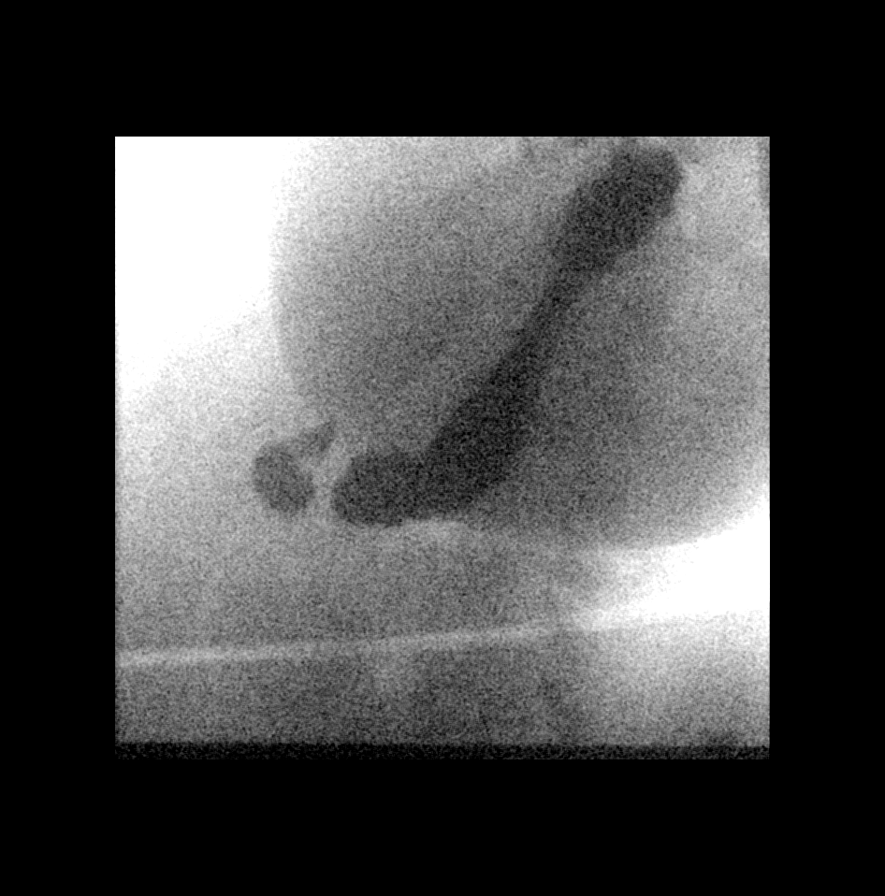

[Series 5: cp_standard · 0.25mm/px · 1 of 1 slices shown (4 of 6)]
[im 1/1]
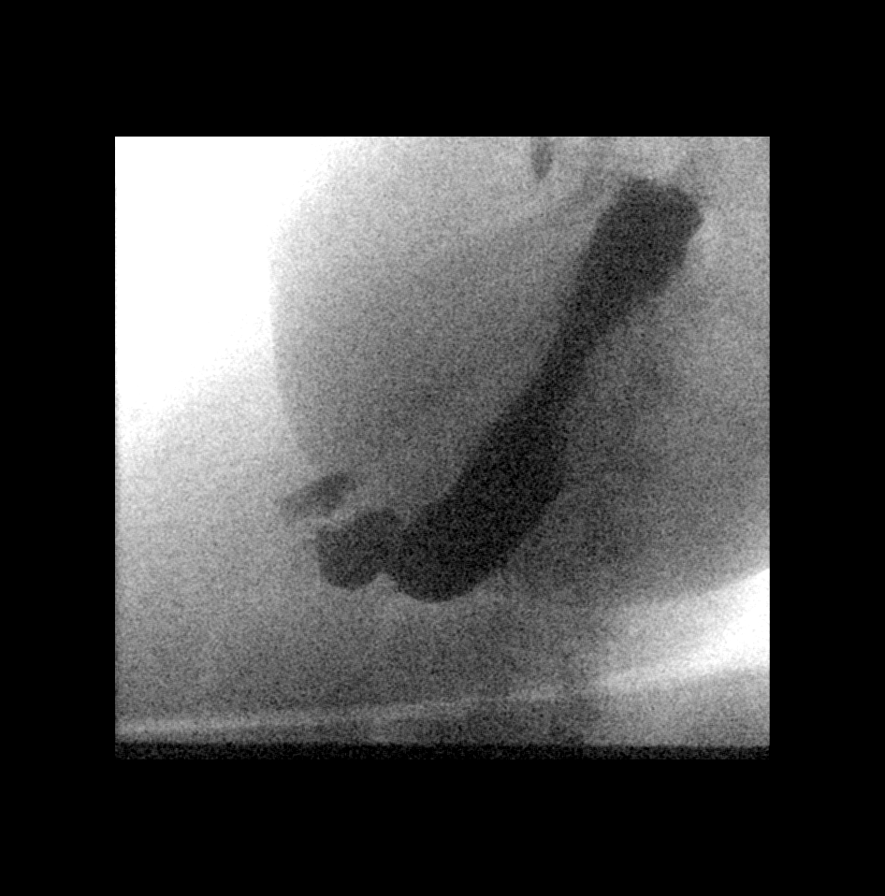

[Series 6: cp_standard · 0.25mm/px · 1 of 1 slices shown (5 of 6)]
[im 1/1]
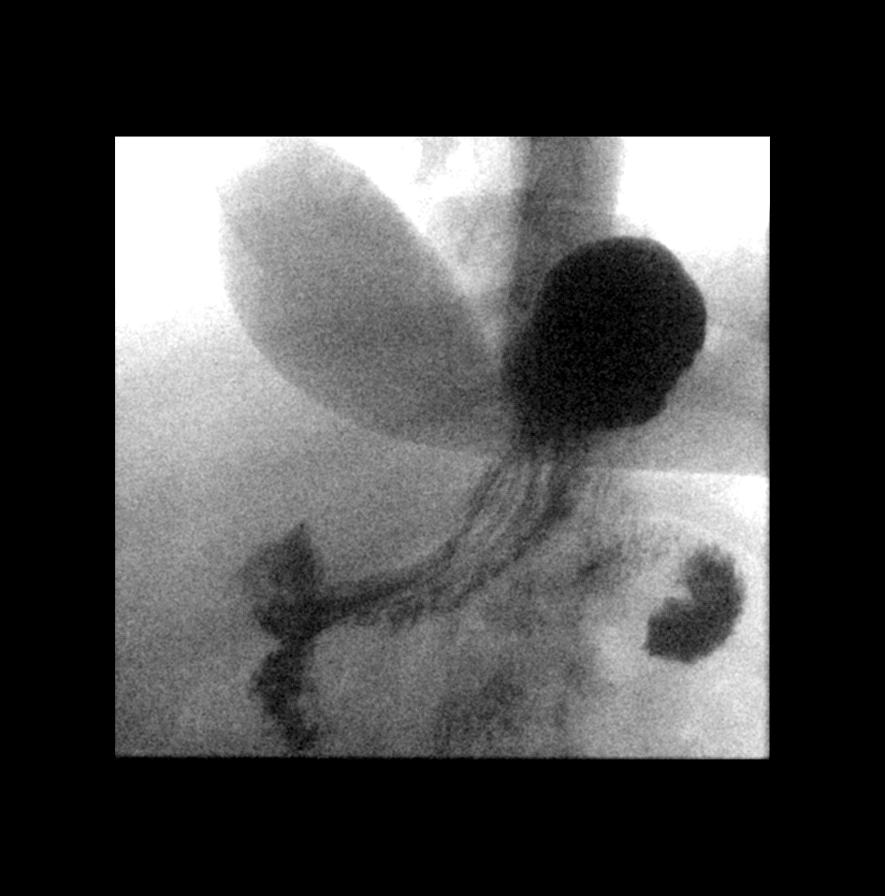

[Series 7: cp_standard · 0.25mm/px · 1 of 1 slices shown (6 of 6)]
[im 1/1]
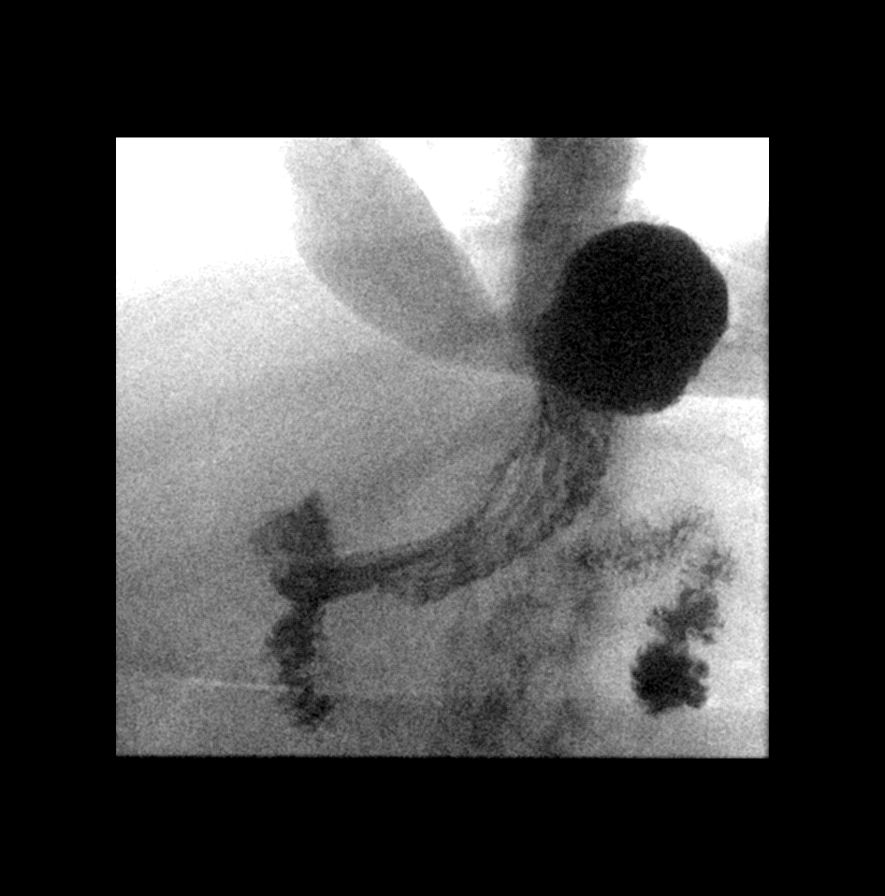

[12 of 13 positions shown; findings below may reference images not displayed]

FINDINGS: Normal esophageal motility with occasional episodes of proximal
escape. No hiatal hernia or GE reflux.

The stomach, duodenum bulb and C-loop are unremarkable. The
duodenaljejunal junction is in its normal anatomic location.
IMPRESSION: Unremarkable upper GI examination.

## 2018-01-10 ENCOUNTER — Ambulatory Visit
Admission: RE | Admit: 2018-01-10 | Discharge: 2018-01-10 | Disposition: A | Discharge status: Home / Self Care | Payer: BLUE CROSS/BLUE SHIELD | Source: Ambulatory Visit | Attending: Surgery | Admitting: Surgery

## 2018-01-10 ENCOUNTER — Other Ambulatory Visit: Payer: Self-pay | Admitting: Surgery

## 2018-01-10 DIAGNOSIS — K219 Gastro-esophageal reflux disease without esophagitis: Secondary | ICD-10-CM

## 2018-03-06 ENCOUNTER — Ambulatory Visit (INDEPENDENT_AMBULATORY_CARE_PROVIDER_SITE_OTHER): Payer: BLUE CROSS/BLUE SHIELD | Admitting: Orthopaedic Surgery

## 2018-03-14 IMAGING — US US ABDOMEN LIMITED
1 series · 14 of 25 positions shown · non-contrast
Comparison: None.

CLINICAL DATA: Pre-surgical workup.

EXAM:
US ABDOMEN LIMITED - RIGHT UPPER QUADRANT

[Series 1: us abdomen limited · 0.28mm/px · 14 of 58 slices shown]
[im 1/58]
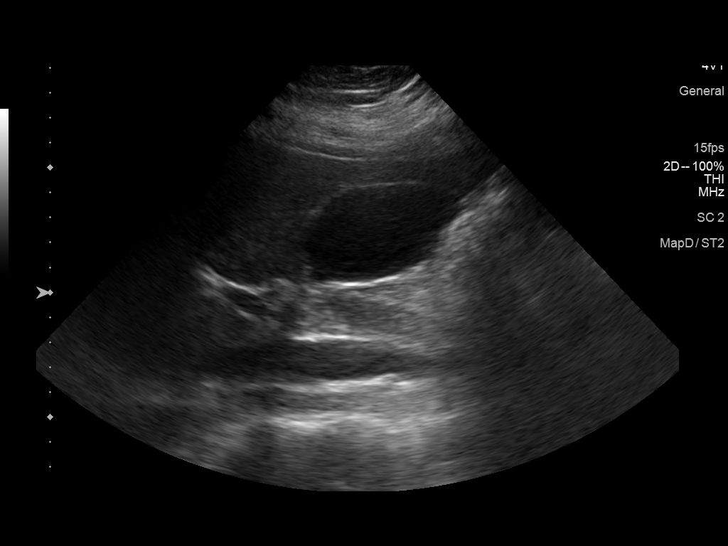
[im 5/58]
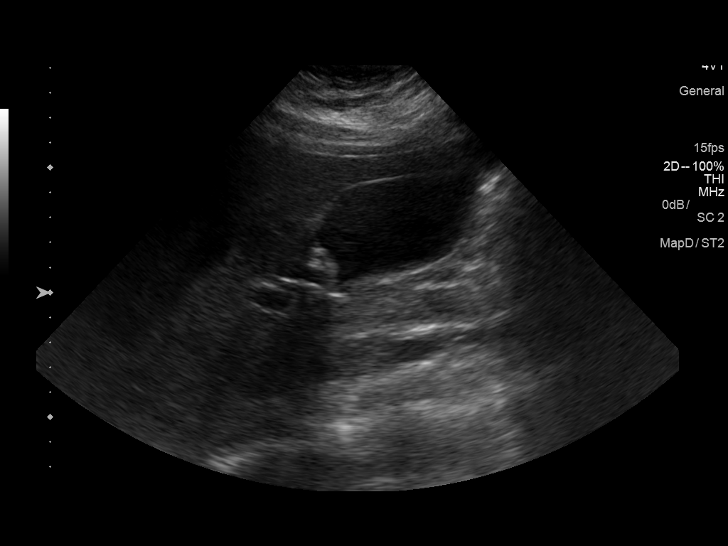
[im 10/58]
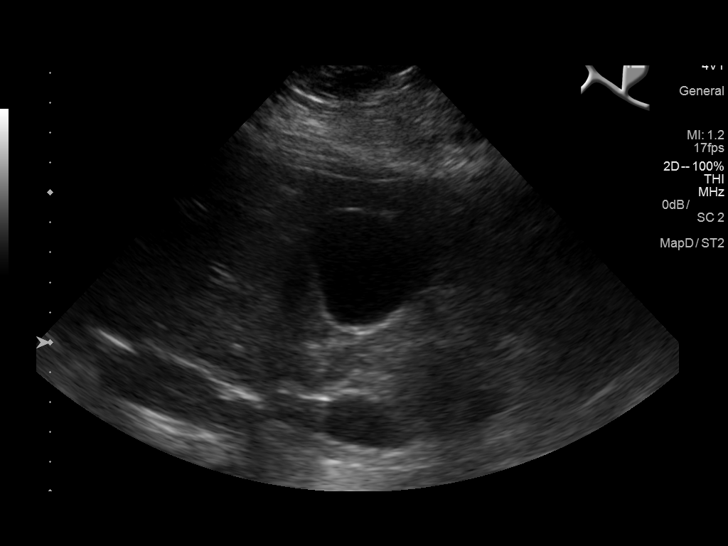
[im 15/58]
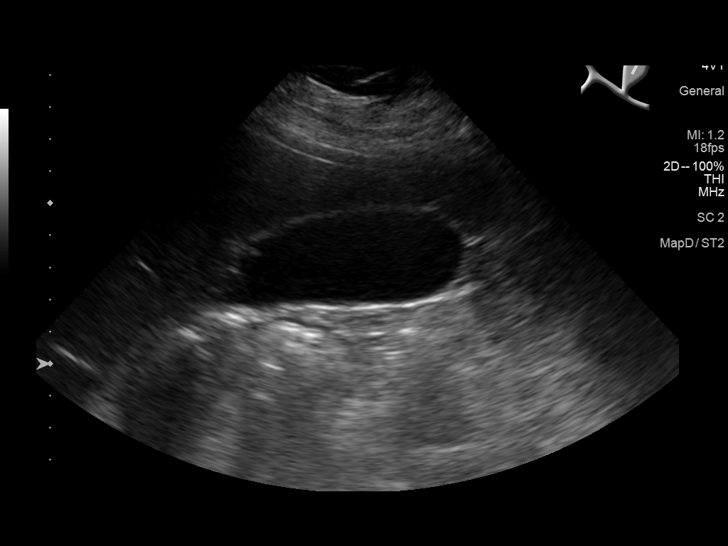
[im 20/58]
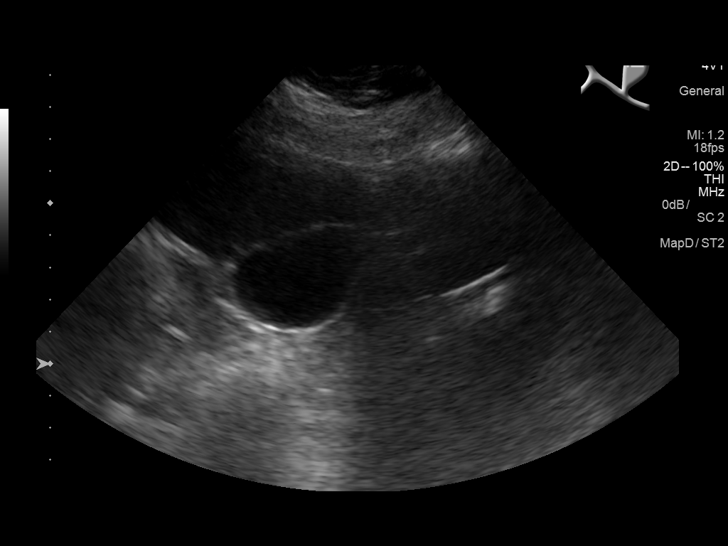
[im 22/58]
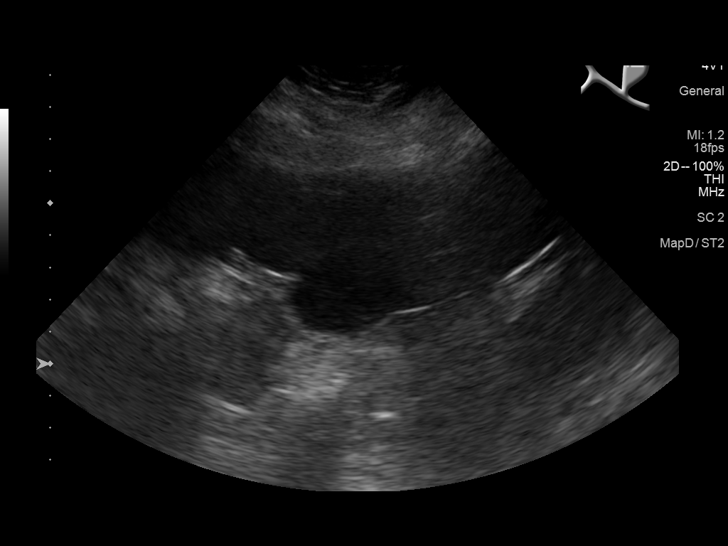
[im 27/58]
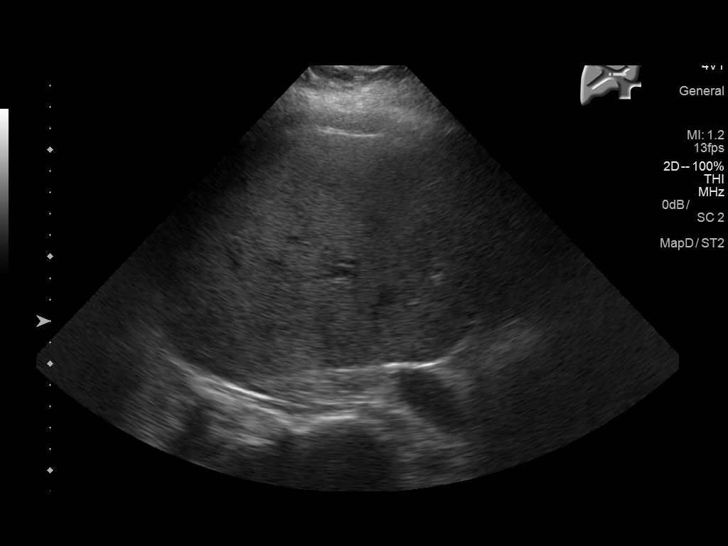
[im 31/58]
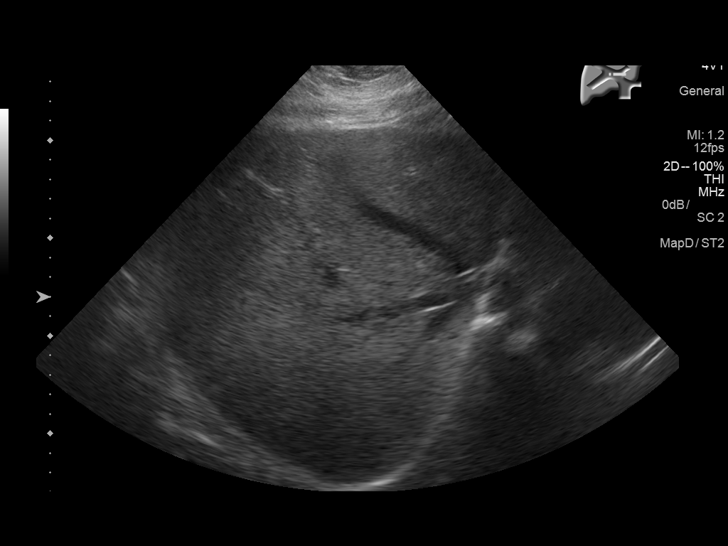
[im 36/58]
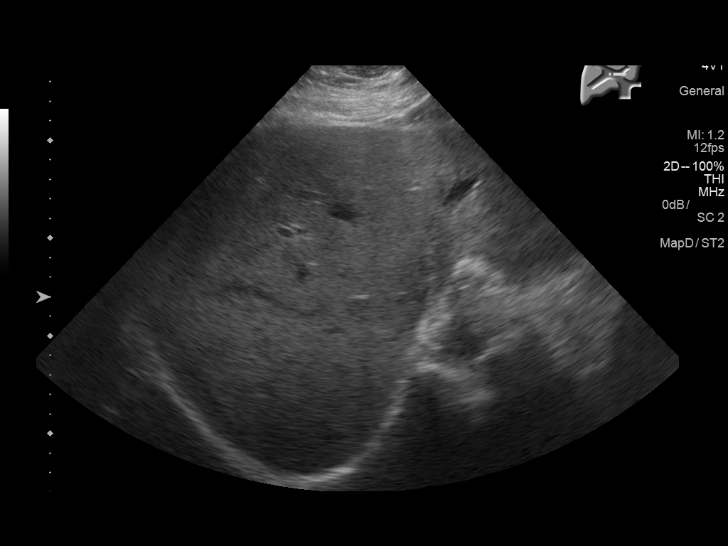
[im 39/58]
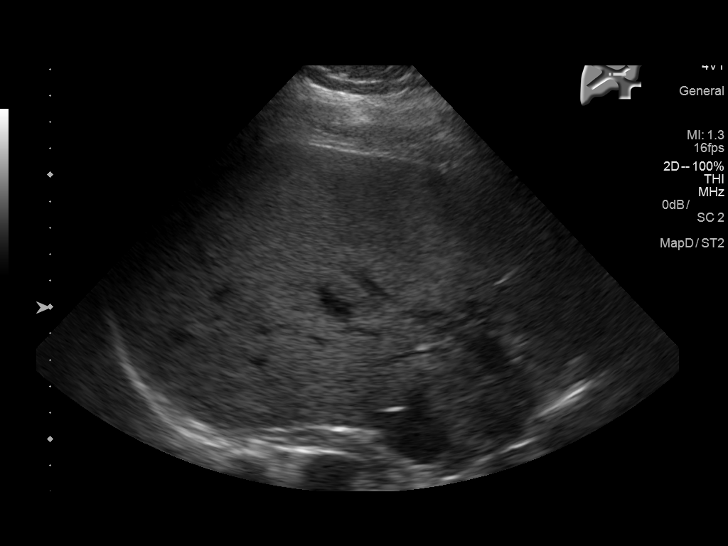
[im 43/58]
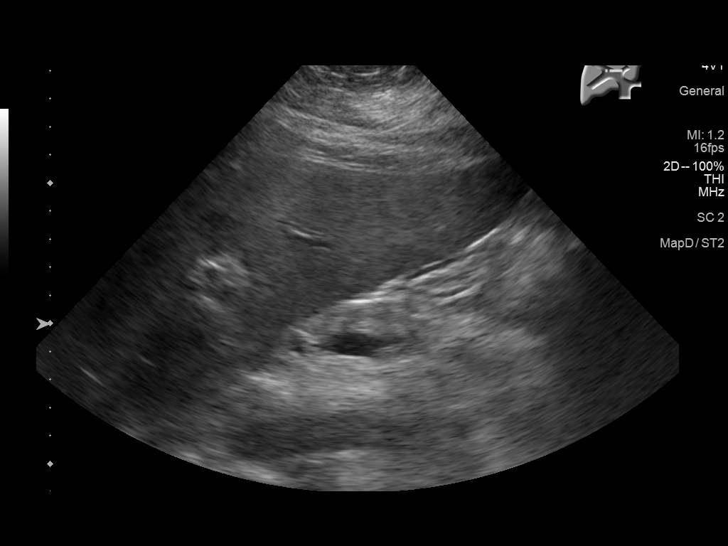
[im 48/58]
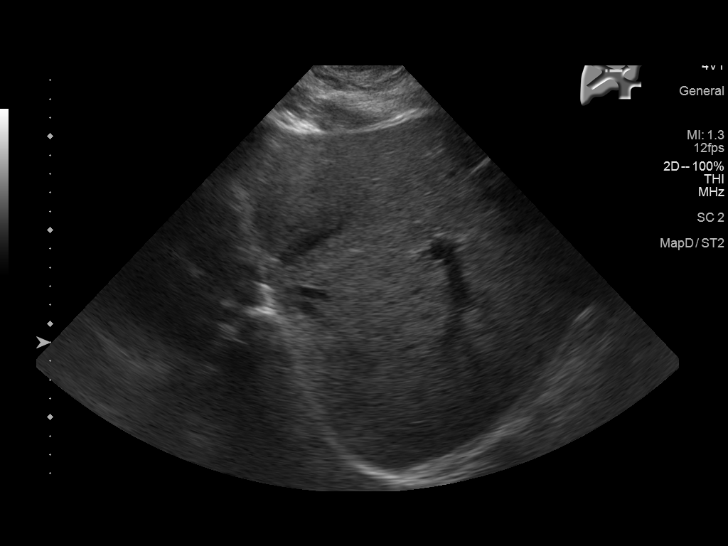
[im 53/58]
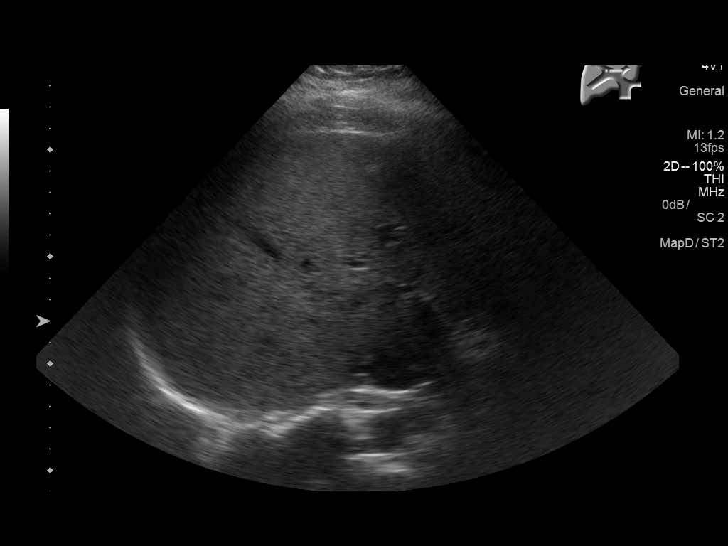
[im 58/58]
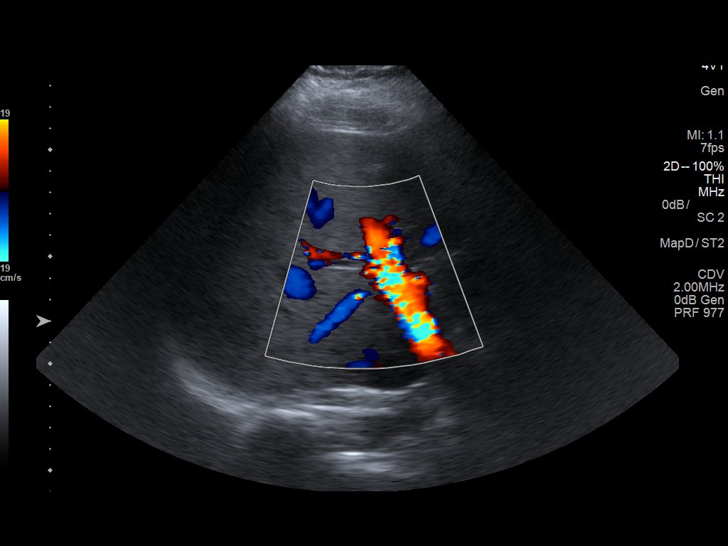

[14 of 25 positions shown; findings below may reference images not displayed]

FINDINGS: Gallbladder:

No gallstones or wall thickening visualized. No sonographic Murphy
sign noted by sonographer.

Common bile duct:

Diameter: 4.3 mm.

Liver:

No focal lesion identified. Within normal limits in parenchymal
echogenicity. Normal directional flow within the portal vein.
IMPRESSION: Normal right upper quadrant ultrasound.

## 2019-12-18 ENCOUNTER — Encounter (HOSPITAL_COMMUNITY): Payer: Self-pay

## 2020-09-13 ENCOUNTER — Other Ambulatory Visit: Payer: Self-pay | Admitting: Obstetrics and Gynecology

## 2020-12-16 ENCOUNTER — Encounter (HOSPITAL_COMMUNITY): Payer: Self-pay | Admitting: *Deleted

## 2021-12-21 ENCOUNTER — Encounter (HOSPITAL_COMMUNITY): Payer: Self-pay | Admitting: *Deleted

## 2022-01-11 ENCOUNTER — Other Ambulatory Visit: Payer: Self-pay | Admitting: Obstetrics and Gynecology

## 2022-03-23 ENCOUNTER — Encounter (HOSPITAL_BASED_OUTPATIENT_CLINIC_OR_DEPARTMENT_OTHER): Payer: Self-pay

## 2022-03-23 ENCOUNTER — Ambulatory Visit (HOSPITAL_BASED_OUTPATIENT_CLINIC_OR_DEPARTMENT_OTHER): Admit: 2022-03-23 | Payer: BLUE CROSS/BLUE SHIELD | Admitting: Obstetrics and Gynecology

## 2022-03-23 SURGERY — SALPINGECTOMY, BILATERAL, LAPAROSCOPIC
Anesthesia: Choice | Laterality: Bilateral

## 2022-12-27 ENCOUNTER — Encounter (HOSPITAL_COMMUNITY): Payer: Self-pay | Admitting: *Deleted

## 2023-12-26 ENCOUNTER — Encounter (HOSPITAL_COMMUNITY): Payer: Self-pay | Admitting: *Deleted
